# Patient Record
Sex: Female | Born: 1985 | Race: White | Hispanic: No | Marital: Married | Smoking: Former smoker
Health system: Southern US, Community
[De-identification: ages and names within clinical notes are randomized; demographics above are authoritative.]

## PROBLEM LIST (undated history)

## (undated) ENCOUNTER — Inpatient Hospital Stay (HOSPITAL_COMMUNITY): Payer: Self-pay

## (undated) DIAGNOSIS — F419 Anxiety disorder, unspecified: Secondary | ICD-10-CM

## (undated) DIAGNOSIS — K219 Gastro-esophageal reflux disease without esophagitis: Secondary | ICD-10-CM

## (undated) DIAGNOSIS — F410 Panic disorder [episodic paroxysmal anxiety] without agoraphobia: Secondary | ICD-10-CM

## (undated) DIAGNOSIS — K5900 Constipation, unspecified: Secondary | ICD-10-CM

## (undated) DIAGNOSIS — Z973 Presence of spectacles and contact lenses: Secondary | ICD-10-CM

## (undated) DIAGNOSIS — T8859XA Other complications of anesthesia, initial encounter: Secondary | ICD-10-CM

## (undated) DIAGNOSIS — N200 Calculus of kidney: Secondary | ICD-10-CM

## (undated) DIAGNOSIS — T4145XA Adverse effect of unspecified anesthetic, initial encounter: Secondary | ICD-10-CM

## (undated) DIAGNOSIS — Z809 Family history of malignant neoplasm, unspecified: Secondary | ICD-10-CM

## (undated) DIAGNOSIS — F32A Depression, unspecified: Secondary | ICD-10-CM

## (undated) DIAGNOSIS — G9050 Complex regional pain syndrome I, unspecified: Secondary | ICD-10-CM

## (undated) DIAGNOSIS — K259 Gastric ulcer, unspecified as acute or chronic, without hemorrhage or perforation: Secondary | ICD-10-CM

## (undated) DIAGNOSIS — F329 Major depressive disorder, single episode, unspecified: Secondary | ICD-10-CM

## (undated) DIAGNOSIS — T7840XA Allergy, unspecified, initial encounter: Secondary | ICD-10-CM

## (undated) HISTORY — DX: Constipation, unspecified: K59.00

## (undated) HISTORY — DX: Depression, unspecified: F32.A

## (undated) HISTORY — DX: Presence of spectacles and contact lenses: Z97.3

## (undated) HISTORY — PX: OTHER SURGICAL HISTORY: SHX169

## (undated) HISTORY — DX: Gastro-esophageal reflux disease without esophagitis: K21.9

## (undated) HISTORY — DX: Major depressive disorder, single episode, unspecified: F32.9

## (undated) HISTORY — DX: Panic disorder (episodic paroxysmal anxiety): F41.0

## (undated) HISTORY — DX: Allergy, unspecified, initial encounter: T78.40XA

## (undated) HISTORY — DX: Anxiety disorder, unspecified: F41.9

## (undated) HISTORY — DX: Family history of malignant neoplasm, unspecified: Z80.9

## (undated) HISTORY — PX: LITHOTRIPSY: SUR834

## (undated) HISTORY — PX: ESOPHAGOGASTRODUODENOSCOPY: SHX1529

## (undated) HISTORY — DX: Gastric ulcer, unspecified as acute or chronic, without hemorrhage or perforation: K25.9

## (undated) HISTORY — DX: Calculus of kidney: N20.0

## (undated) SURGERY — Surgical Case
Anesthesia: *Unknown

---
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
## 2004-01-18 ENCOUNTER — Encounter: Admission: RE | Admit: 2004-01-18 | Discharge: 2004-01-18 | Payer: Self-pay | Admitting: Family Medicine

## 2004-06-18 ENCOUNTER — Emergency Department (HOSPITAL_COMMUNITY): Admission: EM | Admit: 2004-06-18 | Discharge: 2004-06-19 | Payer: Self-pay | Admitting: Emergency Medicine

## 2005-01-15 ENCOUNTER — Encounter: Admission: RE | Admit: 2005-01-15 | Discharge: 2005-01-15 | Payer: Self-pay | Admitting: Family Medicine

## 2006-01-02 ENCOUNTER — Emergency Department (HOSPITAL_COMMUNITY): Admission: EM | Admit: 2006-01-02 | Discharge: 2006-01-02 | Payer: Self-pay | Admitting: Emergency Medicine

## 2007-01-11 ENCOUNTER — Ambulatory Visit: Payer: Self-pay | Admitting: Internal Medicine

## 2007-01-20 ENCOUNTER — Ambulatory Visit: Payer: Self-pay | Admitting: Internal Medicine

## 2007-01-20 ENCOUNTER — Encounter (INDEPENDENT_AMBULATORY_CARE_PROVIDER_SITE_OTHER): Payer: Self-pay | Admitting: Specialist

## 2008-10-04 ENCOUNTER — Emergency Department (HOSPITAL_COMMUNITY): Admission: EM | Admit: 2008-10-04 | Discharge: 2008-10-04 | Payer: Self-pay | Admitting: Family Medicine

## 2009-02-26 ENCOUNTER — Emergency Department (HOSPITAL_COMMUNITY): Admission: EM | Admit: 2009-02-26 | Discharge: 2009-02-26 | Payer: Self-pay | Admitting: Family Medicine

## 2009-06-04 ENCOUNTER — Emergency Department (HOSPITAL_COMMUNITY): Admission: EM | Admit: 2009-06-04 | Discharge: 2009-06-04 | Payer: Self-pay | Admitting: Emergency Medicine

## 2009-09-06 ENCOUNTER — Ambulatory Visit (HOSPITAL_COMMUNITY): Admission: RE | Admit: 2009-09-06 | Discharge: 2009-09-06 | Payer: Self-pay | Admitting: Psychiatry

## 2009-09-06 ENCOUNTER — Emergency Department (HOSPITAL_COMMUNITY): Admission: EM | Admit: 2009-09-06 | Discharge: 2009-09-07 | Payer: Self-pay | Admitting: Emergency Medicine

## 2009-09-07 ENCOUNTER — Inpatient Hospital Stay: Payer: Self-pay | Admitting: Psychiatry

## 2010-02-11 ENCOUNTER — Emergency Department (HOSPITAL_COMMUNITY): Admission: EM | Admit: 2010-02-11 | Discharge: 2010-02-11 | Payer: Self-pay | Admitting: Emergency Medicine

## 2010-04-12 ENCOUNTER — Emergency Department (HOSPITAL_COMMUNITY): Admission: EM | Admit: 2010-04-12 | Discharge: 2010-04-12 | Payer: Self-pay | Admitting: Emergency Medicine

## 2011-01-27 LAB — POCT URINALYSIS DIP (DEVICE)
Bilirubin Urine: NEGATIVE
Glucose, UA: NEGATIVE mg/dL
Ketones, ur: NEGATIVE mg/dL
Nitrite: NEGATIVE

## 2011-01-29 LAB — URINALYSIS, ROUTINE W REFLEX MICROSCOPIC
Nitrite: NEGATIVE
Protein, ur: 30 mg/dL — AB
Urobilinogen, UA: 1 mg/dL (ref 0.0–1.0)
pH: 5.5 (ref 5.0–8.0)

## 2011-01-29 LAB — BASIC METABOLIC PANEL
CO2: 23 mEq/L (ref 19–32)
Calcium: 8.7 mg/dL (ref 8.4–10.5)
Chloride: 107 mEq/L (ref 96–112)
Creatinine, Ser: 0.8 mg/dL (ref 0.4–1.2)
GFR calc non Af Amer: >60 mL/min (ref >60)

## 2011-01-29 LAB — URINE CULTURE: Colony Count: >=100000

## 2011-01-29 LAB — WET PREP, GENITAL

## 2011-01-29 LAB — DIFFERENTIAL
Basophils Relative: 0 % (ref 0–1)
Eosinophils Absolute: 0.1 10*3/uL (ref 0.0–0.7)
Eosinophils Relative: 1 % (ref 0–5)
Lymphocytes Relative: 22 % (ref 12–46)

## 2011-01-29 LAB — CBC
HCT: 40.3 % (ref 36.0–46.0)
Hemoglobin: 13.6 g/dL (ref 12.0–15.0)
MCHC: 33.8 g/dL (ref 30.0–36.0)
MCV: 93.8 fL (ref 78.0–100.0)
RDW: 13.7 % (ref 11.5–15.5)
WBC: 11.1 10*3/uL — ABNORMAL HIGH (ref 4.0–10.5)

## 2011-01-29 LAB — URINE MICROSCOPIC-ADD ON

## 2011-02-13 LAB — ETHANOL: Alcohol, Ethyl (B): <5 mg/dL (ref 0–10)

## 2011-02-13 LAB — DIFFERENTIAL
Eosinophils Absolute: 0 10*3/uL (ref 0.0–0.7)
Eosinophils Relative: 0 % (ref 0–5)
Monocytes Absolute: 0.4 10*3/uL (ref 0.1–1.0)
Monocytes Relative: 6 % (ref 3–12)
Neutro Abs: 4.9 10*3/uL (ref 1.7–7.7)
Neutrophils Relative %: 70 % (ref 43–77)

## 2011-02-13 LAB — RAPID URINE DRUG SCREEN, HOSP PERFORMED
Barbiturates: NOT DETECTED
Tetrahydrocannabinol: POSITIVE — AB

## 2011-02-13 LAB — CBC
Hemoglobin: 14.5 g/dL (ref 12.0–15.0)
MCHC: 34.8 g/dL (ref 30.0–36.0)
MCV: 94.8 fL (ref 78.0–100.0)
RDW: 12.8 % (ref 11.5–15.5)

## 2011-02-13 LAB — BASIC METABOLIC PANEL
Creatinine, Ser: 0.63 mg/dL (ref 0.4–1.2)
GFR calc Af Amer: >60 mL/min (ref >60)
Glucose, Bld: 111 mg/dL — ABNORMAL HIGH (ref 70–99)
Sodium: 135 mEq/L (ref 135–145)

## 2011-02-19 LAB — DIFFERENTIAL
Basophils Absolute: 0 10*3/uL (ref 0.0–0.1)
Basophils Relative: 1 % (ref 0–1)
Eosinophils Relative: 2 % (ref 0–5)
Lymphocytes Relative: 41 % (ref 12–46)
Monocytes Absolute: 0.4 10*3/uL (ref 0.1–1.0)
Monocytes Relative: 8 % (ref 3–12)
Neutro Abs: 2.5 10*3/uL (ref 1.7–7.7)
Neutrophils Relative %: 48 % (ref 43–77)

## 2011-02-19 LAB — T3: T3, Total: 75 ng/dl — ABNORMAL LOW (ref 80.0–204.0)

## 2011-02-19 LAB — POCT URINALYSIS DIP (DEVICE)
Glucose, UA: NEGATIVE mg/dL
Hgb urine dipstick: NEGATIVE
pH: 7.5 (ref 5.0–8.0)

## 2011-02-19 LAB — CBC
Hemoglobin: 15.8 g/dL — ABNORMAL HIGH (ref 12.0–15.0)
MCHC: 35.7 g/dL (ref 30.0–36.0)
RBC: 4.75 MIL/uL (ref 3.87–5.11)
WBC: 5.2 10*3/uL (ref 4.0–10.5)

## 2011-02-19 LAB — HIV ANTIBODY (ROUTINE TESTING W REFLEX): HIV: NONREACTIVE

## 2011-03-28 NOTE — Op Note (Signed)
   NAME:  ERMAL, BRZOZOWSKI                      ACCOUNT NO.:  [id number]   MEDICAL RECORD NO.:  [id number]                   PATIENT TYPE:  AMB   LOCATION:  [facility]SC                                  FACILITY:  [facility]   PHYSICIAN:  Leonia Corona, M.D.               DATE OF BIRTH:  1986/03/12   DATE OF PROCEDURE:  DATE OF DISCHARGE:                                 OPERATIVE REPORT   PREOPERATIVE DIAGNOSES:  Left painful heel radiculopathy with possible  foreign body.   POSTOPERATIVE DIAGNOSES:  Left painful heel radiculopathy with possible  foreign body.   PROCEDURE:  Exploration of left heel for foreign body.   ANESTHESIA:  Topical plus local.   SURGEON:  Leonia Corona, M.D.   ASSISTANT:  Nurse.   DESCRIPTION OF PROCEDURE:  The procedure is performed in the minor surgery  room. The patient is brought into the room, placed prone on the operating  table.  The left heel was elevated and exposed clearly.  The left heel was  already covered with topical antibiotic cream prior to injection of local  anesthesia.  The area was cleaned, prepped and draped in the usual manner.  Approximately 4.0 cc of 1% lidocaine with epinephrine was infiltrated around  the callosity in the tender spot and an elliptical incision was made  enclosing the central core of the tender spot and it was deepened through  the deeper layers and a core of the tissue was removed.  The area was  inspected and explored into the planter fascia for any foreign body; none  was noted.  The wound was irrigated and then packed with Betadine ointment,  having found no foreign body in the wound.  The wound was kept open and  allowed to heal by granulation.  A sterile gauze dressing was applied which  was covered with Kerlix and Ace wrap.  The patient tolerated the procedure  very well which was smooth and uneventful.  The patient was later allowed to  go home with daily dressing changes using warm soaks and Neosporin  ointment.  February 28, 2003                                               Leonia Corona, M.D.    SF/MEDQ  D:  03/01/2003  T:  03/01/2003  Job:  [id number]

## 2011-03-28 NOTE — Assessment & Plan Note (Signed)
[facility]                         GASTROENTEROLOGY OFFICE NOTE   JOMAYRA, NOVITSKY                   MRN:          [id number]  DATE:01/11/2007                            DOB:          01/30/1986    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  She has had burning substernally and also some  epigastric pain which is worse with drinking alcohol.  She dates onset  of heartburn  to grade school.  She was seen by pediatrician for  abdominal pain.  Later on, about 3 years ago, upper GI series was done  because of gastroesophageal reflux.  Apparently, the exam was normal but  she was put on Nexium 40 mg daily.  She stayed on it for a while.  It  helped about 50% of the time but did not eliminate the reflux  completely.  Her father died 1 year ago with Barrett's esophagus and  adenocarcinoma of the esophagus.  He was a patient in our practice and  we took care of him.  Her brother, who is about 46 years old, also has  gastroesophageal reflux and we saw him also as a patient last year and  he had upper endoscopy but did not have Barrett's esophagus.   Amber Pope has lost some weight because of decreased appetite and  depression.  She was put on Lexapro after the death of her father at the  dose 10 mg a day but is no longer on it.  She said she was recently  hospitalized at [facility] with kidney stones and underwent stent  placement and removal of kidney stones.  She had CT scans and exams  under sedation at [facility] in [place].  She denies  hoarseness, change of the voice, choking or coughing at night.   MEDICATIONS:  None.   PAST MEDICAL HISTORY:  1. Significant for asthmatic bronchitis.  2. Anxiety.  3. Panic disorder.  4. Depression.  5. Kidney stones.  6. Allergies.   FAMILY HISTORY:  Her father had cancer of the esophagus and died last  year.   SOCIAL HISTORY:  She  is single.  Archivist.  Currently withdrew  for medical reasons.  She smokes 1-2 cigarettes a day and drinks excess  alcohol at least 12 beers a week and probably liquor and wine on a daily  basis.   REVIEW OF SYSTEMS:  Positive for allergies, frequent cough, sleeping  problems, blood in urine.   PHYSICAL EXAMINATION:  Blood pressure 100/62, pulse 76 and weight 112  pounds.  Usual weight 98 to 120 pounds.  She was alert, oriented and in  no distress.  Sclerae nonicteric.  Oral cavity normal.  Neck was supple.  No lymphadenopathy.  Lungs were clear to auscultation.  Cor with normal  S1-S2.  Abdomen was soft, tender in the epigastrium and subxiphoid area.  No distention.  Normal active bowel sounds.  Right upper quadrant was  normal.  Rectal exam not done.  Extremities show edema.   IMPRESSION:  A 26 year old white female with chronic gastroesophageal  reflux  and family history of esophageal cancer in her father and reflux  in her brother who has been under a great deal of stress. Smoking and  drinking excess alcohol  has undoubtedly been a contributing factor .She  is at high risk for  Barrett's esophagus.  She is currently not on any  medication because she ran out of Nexium which was not helping  completely anyway.   PLAN:  1. Upper endoscopy with biopsies to assess for anatomic abnormalities.  2. I have asked the patient to stop drinking alcohol and gave her a      rather serious talk about it.  3. Avoid caffeine and smoking as well.  4. Nexium 40 mg daily.  Prescription as well as samples given.  She      was started on 2 a day for the next 3 days and then 1 a day.  5. Prescription for Lexapro 10 mg daily.  She was on it in the past      but needs a new prescription.     Hedwig Morton. Juanda Chance, MD  Electronically Signed    DMB/MedQ  DD: 01/11/2007  DT: 01/12/2007  Job #: [id number]

## 2011-09-04 ENCOUNTER — Encounter: Payer: Self-pay | Admitting: *Deleted

## 2011-09-04 ENCOUNTER — Telehealth: Payer: Self-pay | Admitting: Internal Medicine

## 2011-09-04 NOTE — Telephone Encounter (Signed)
OK 

## 2011-09-04 NOTE — Telephone Encounter (Signed)
Spoke with patient and she states she is living in [place] now. States since August, she has been to the ER there x2 with vomiting blood and in the hospital x 3. She states she has lost weight and now weighs 94 lbs.The hospital there has referred her to the "free clinic" for EGD but she has not been scheduled. She has no insurance. She is taking Protonix daily and Pepcid AC and Gaviscon prn. She wants to be seen by Dr. Juanda Chance again, She states she wants to pay for the visit and she her because she has seen her in the past. Scheduled patient on 09/16/11 at 10:45AM.  She understands to seek care at ER in [place] if she has problems before her visit.Patient is going to have all records faxed to [place] from her hospital visits in [place]. Mailed patient a letter and information about paying your bill with [facility]. Last OV 01/11/2007, Last EGD 01/20/2007.

## 2011-09-15 ENCOUNTER — Encounter: Payer: Self-pay | Admitting: Internal Medicine

## 2011-09-16 ENCOUNTER — Encounter: Payer: Self-pay | Admitting: Internal Medicine

## 2011-09-16 ENCOUNTER — Ambulatory Visit (INDEPENDENT_AMBULATORY_CARE_PROVIDER_SITE_OTHER): Payer: Self-pay | Admitting: Internal Medicine

## 2011-09-16 VITALS — BP 90/68 | HR 68 | Ht 64.0 in | Wt 101.2 lb

## 2011-09-16 DIAGNOSIS — R112 Nausea with vomiting, unspecified: Secondary | ICD-10-CM

## 2011-09-16 DIAGNOSIS — K219 Gastro-esophageal reflux disease without esophagitis: Secondary | ICD-10-CM

## 2011-09-16 DIAGNOSIS — K920 Hematemesis: Secondary | ICD-10-CM

## 2011-09-16 MED ORDER — PROMETHAZINE HCL 25 MG RE SUPP: 25.0000 mg | Freq: Four times a day (QID) | RECTAL | Status: DC | PRN

## 2011-09-16 MED ORDER — PROMETHAZINE HCL 25 MG PO TABS: 25.0000 mg | ORAL_TABLET | Freq: Four times a day (QID) | ORAL | Status: DC | PRN

## 2011-09-16 MED ORDER — ESOMEPRAZOLE MAGNESIUM 40 MG PO CPDR: 40.0000 mg | DELAYED_RELEASE_CAPSULE | Freq: Every day | ORAL | Status: DC

## 2011-09-16 NOTE — Progress Notes (Signed)
Amber Pope 08-Nov-1986 MRN [id number]   History of Present Illness:  This is a 25 year old black female with epigastric pain and intractable nausea and vomiting of several weeks duration. She was seen in the emergency room on 3 separate occasions and was sent home. A CT scan of the abdomen shows small ovarian cysts. I saw her in 2008 for gastroesophageal reflux. She has a history of a functional GI disorder. Her father died of gastric cancer from Barrett's esophagus. Her upper endoscopy in the March 2008 was essentially normal. She has been on Pepcid and Protonix without much improvement. She has a lot of anxiety and panic disorder. She was having coffee ground emesis on one of the occasions.   Past Medical History  Diagnosis Date  . GERD (gastroesophageal reflux disease)   . Renal calculus   . Depression   . Panic disorder    Past Surgical History  Procedure Date  . Lithotripsy     x 2  . Kidney stent placement     with later removal    reports that she has never smoked. She has never used smokeless tobacco. She reports that she drinks alcohol. She reports that she does not use illicit drugs. family history includes Barrett's esophagus in her father; Diabetes in her maternal grandfather and paternal grandfather; Esophageal cancer in her father; GER disease in her father and mother; and Ulcers in her brother. Allergies  Allergen Reactions  . Lodine (Etodolac)         Review of Systems: Denies dysphagia or odynophagia. Denies that diarrhea or rectal bleeding  The remainder of the 10 point ROS is negative except as outlined in H&P   Physical Exam: General appearance  Well developed, in no distress. Eyes- non icteric. HEENT nontraumatic, normocephalic. Mouth no lesions, tongue papillated, no cheilosis. Neck supple without adenopathy, thyroid not enlarged, no carotid bruits, no JVD. Lungs Clear to auscultation bilaterally. Cor normal S1, normal S2, regular rhythm, no  murmur,  quiet precordium. Abdomen: Soft nontender with normoactive bowel sounds. No distention. Rectal: Soft Hemoccult negative stool. Extremities no pedal edema. Skin no lesions. Neurological alert and oriented x 3. Psychological normal mood and affect.  Assessment and Plan:  Problem #1 Intractable nausea, vomiting and weight loss of 6 pounds in a patient with anxiety and panic disorder already on medications which include Zoloft and Xanax. She has had recent coffee ground emesis indicating either gastritis or esophagitis or possibly a Mallory-Weiss tear. She has no insurance so her workup has been limited. We will proceed with an upper endoscopy. I have given her samples of Nexium 40 mg twice a day. I have also sent to the pharmacy Phenergan 25 mg tablets as well as suppositories. She will purchase Ensure and Boost for nutritional supplementation.   09/16/2011 Amber Pope

## 2011-09-16 NOTE — Patient Instructions (Signed)
You have been scheduled for an endoscopy. Please follow written instructions given to you at your visit today. We have sent the following medications to your pharmacy for you to pick up at your convenience: Phenergan Suppositories Phenergan tablets We have given you samples of Nexium to take twice daily.

## 2011-09-17 ENCOUNTER — Ambulatory Visit (AMBULATORY_SURGERY_CENTER): Payer: Self-pay | Admitting: Internal Medicine

## 2011-09-17 ENCOUNTER — Encounter: Payer: Self-pay | Admitting: Internal Medicine

## 2011-09-17 VITALS — BP 113/73 | HR 78 | Temp 98.6°F | Resp 18 | Ht 64.0 in | Wt 101.0 lb

## 2011-09-17 DIAGNOSIS — K219 Gastro-esophageal reflux disease without esophagitis: Secondary | ICD-10-CM

## 2011-09-17 DIAGNOSIS — R112 Nausea with vomiting, unspecified: Secondary | ICD-10-CM

## 2011-09-17 DIAGNOSIS — K920 Hematemesis: Secondary | ICD-10-CM

## 2011-09-17 MED ORDER — HYDROCODONE-ACETAMINOPHEN 5-500 MG PO TABS: 1.0000 | ORAL_TABLET | Freq: Four times a day (QID) | ORAL | Status: AC | PRN

## 2011-09-17 MED ORDER — SODIUM CHLORIDE 0.9 % IV SOLN: 500.0000 mL | INTRAVENOUS | Status: DC

## 2011-09-17 NOTE — Patient Instructions (Signed)
Please refer to blue and green discharge instruction sheets. 

## 2011-09-18 ENCOUNTER — Telehealth: Payer: Self-pay | Admitting: *Deleted

## 2011-09-18 NOTE — Telephone Encounter (Signed)
Voicemail message left

## 2011-09-22 ENCOUNTER — Encounter: Payer: Self-pay | Admitting: Internal Medicine

## 2011-10-23 ENCOUNTER — Telehealth: Payer: Self-pay | Admitting: Internal Medicine

## 2011-10-23 NOTE — Telephone Encounter (Signed)
Letter created and mailed to patient. 

## 2012-12-27 ENCOUNTER — Other Ambulatory Visit: Payer: Self-pay | Admitting: Obstetrics and Gynecology

## 2013-01-05 ENCOUNTER — Encounter (HOSPITAL_COMMUNITY)
Admission: RE | Admit: 2013-01-05 | Discharge: 2013-01-05 | Disposition: A | Discharge status: Home / Self Care | Payer: BC Managed Care – PPO | Source: Ambulatory Visit | Attending: Obstetrics and Gynecology | Admitting: Obstetrics and Gynecology

## 2013-01-05 ENCOUNTER — Encounter (HOSPITAL_COMMUNITY): Payer: Self-pay

## 2013-01-05 HISTORY — DX: Other complications of anesthesia, initial encounter: T88.59XA

## 2013-01-05 HISTORY — DX: Adverse effect of unspecified anesthetic, initial encounter: T41.45XA

## 2013-01-05 LAB — CBC
HCT: 37.7 % (ref 36.0–46.0)
Hemoglobin: 12.9 g/dL (ref 12.0–15.0)
MCH: 32.2 pg (ref 26.0–34.0)
RBC: 4.01 MIL/uL (ref 3.87–5.11)

## 2013-01-05 LAB — SURGICAL PCR SCREEN
MRSA, PCR: INVALID — AB
Staphylococcus aureus: INVALID — AB

## 2013-01-05 NOTE — Patient Instructions (Addendum)
20 Amber Pope  01/05/2013   Your procedure is scheduled on:  01/06/13  Enter through the Main Entrance of [facility] at 830 AM.  Pick up the phone at the desk and dial [phone].   Call this number if you have problems the morning of surgery: [phone]   Remember:   Do not eat food:After Midnight.  Do not drink clear liquids: After Midnight.  Take these medicines the morning of surgery with A SIP OF WATER: Xanax if needed, take Protonix may take Prozac and Zoloft   Do not wear jewelry, make-up or nail polish.  Do not wear lotions, powders, or perfumes. You may wear deodorant.  Do not shave 48 hours prior to surgery.  Do not bring valuables to the hospital.  Contacts, dentures or bridgework may not be worn into surgery.  Leave suitcase in the car. After surgery it may be brought to your room.  For patients admitted to the hospital, checkout time is 11:00 AM the day of discharge.   Patients discharged the day of surgery will not be allowed to drive home.  Name and phone number of your driver: Mother    Amber Pope  Special Instructions: Shower using CHG 2 nights before surgery and the night before surgery.  If you shower the day of surgery use CHG.  Use special wash - you have one bottle of CHG for all showers.  You should use approximately 1/3 of the bottle for each shower.   Please read over the following fact sheets that you were given: MRSA Information

## 2013-01-05 NOTE — H&P (Signed)
[name], [name] NO.:  [id number]  MEDICAL RECORD NO.:  [id number]  LOCATION:                                 FACILITY:  PHYSICIAN:  Lenoard Aden, M.D.DATE OF BIRTH:  04/30/1986  DATE OF ADMISSION: DATE OF DISCHARGE:                             HISTORY & PHYSICAL   CHIEF COMPLAINT:  The patient has right lower quadrant pain with a history of ovarian cyst, suggest endometriosis.  She is a 27 year old white female, G0, P0 with chronic right lower quadrant pain, history of ovarian cyst, dysmenorrhea, dyspareunia who presents for diagnostic laparoscopy and possible daVinci assisted resection and ablation of endometriosis.  MEDICATIONS:   Xanax and Prozac.  She has allergies to codeine.  She has a family history of esophageal cancer, type 2 diabetes, and thyroid disease.  SOCIAL HISTORY:  Noncontributory.  SURGICAL HISTORY:  Noncontributory.  FAMILY HISTORY:  Noncontributory.  PHYSICAL EXAMINATION:  GENERAL:  She is a well-developed and well- nourished white female, in no acute distress. HEENT:  Normal. NECK:  Supple.  Full range of motion. LUNGS:  Clear. HEART:  Regular rhythm. ABDOMEN:  Soft and nontender. PELVIC:  Tenderness in bilateral lower quadrants, no mass. EXTREMITIES:  There were no cords. NEUROLOGIC:  Nonfocal. SKIN:  Intact.  IMPRESSION:  Chronic right lower quadrant pain with history of ovarian cyst, possible endometriosis.  Family history noted.  PLAN:  Proceed with Diagnostic Laparoscopy, possible daVinci assisted procedure as noted.   Risks of anesthesia, infection, bleeding, injury to abdominal organs were discussed.  Delayed versus immediate complications to include bowel and bladder injury noted. Inability to diagnose and cure all pain discussed. The patient acknowledges and wishes to proceed.     Lenoard Aden, M.D.     RJT/MEDQ  D:  01/05/2013  T:  01/05/2013  Job:  [id number]

## 2013-01-06 ENCOUNTER — Encounter (HOSPITAL_COMMUNITY): Payer: Self-pay | Admitting: *Deleted

## 2013-01-06 ENCOUNTER — Ambulatory Visit (HOSPITAL_COMMUNITY): Payer: BC Managed Care – PPO | Admitting: Registered Nurse

## 2013-01-06 ENCOUNTER — Encounter (HOSPITAL_COMMUNITY): Payer: Self-pay | Admitting: Registered Nurse

## 2013-01-06 ENCOUNTER — Ambulatory Visit (HOSPITAL_COMMUNITY)
Admission: RE | Admit: 2013-01-06 | Discharge: 2013-01-06 | Disposition: A | Discharge status: Home / Self Care | Payer: BC Managed Care – PPO | Source: Ambulatory Visit | Attending: Obstetrics and Gynecology | Admitting: Obstetrics and Gynecology

## 2013-01-06 ENCOUNTER — Encounter (HOSPITAL_COMMUNITY)
Admission: RE | Disposition: A | Discharge status: Home / Self Care | Payer: Self-pay | Source: Ambulatory Visit | Attending: Obstetrics and Gynecology

## 2013-01-06 DIAGNOSIS — N80109 Endometriosis of ovary, unspecified side, unspecified depth: Secondary | ICD-10-CM | POA: Insufficient documentation

## 2013-01-06 DIAGNOSIS — N803 Endometriosis of pelvic peritoneum, unspecified: Secondary | ICD-10-CM | POA: Insufficient documentation

## 2013-01-06 DIAGNOSIS — IMO0002 Reserved for concepts with insufficient information to code with codable children: Secondary | ICD-10-CM | POA: Insufficient documentation

## 2013-01-06 DIAGNOSIS — N801 Endometriosis of ovary: Secondary | ICD-10-CM | POA: Insufficient documentation

## 2013-01-06 DIAGNOSIS — N946 Dysmenorrhea, unspecified: Secondary | ICD-10-CM | POA: Insufficient documentation

## 2013-01-06 DIAGNOSIS — N809 Endometriosis, unspecified: Secondary | ICD-10-CM

## 2013-01-06 DIAGNOSIS — R1031 Right lower quadrant pain: Secondary | ICD-10-CM | POA: Insufficient documentation

## 2013-01-06 DIAGNOSIS — D252 Subserosal leiomyoma of uterus: Secondary | ICD-10-CM | POA: Insufficient documentation

## 2013-01-06 HISTORY — PX: ABLATION ON ENDOMETRIOSIS: SHX5787

## 2013-01-06 HISTORY — PX: ROBOTIC ASSISTED LAPAROSCOPIC LYSIS OF ADHESION: SHX6080

## 2013-01-06 SURGERY — ROBOTIC ASSISTED LAPAROSCOPIC LYSIS OF ADHESION
Anesthesia: General | Wound class: Clean Contaminated

## 2013-01-06 MED ORDER — CEFAZOLIN SODIUM-DEXTROSE 2-3 GM-% IV SOLR
2.0000 g | INTRAVENOUS | Status: AC
  Administered 2013-01-06: 2 g via INTRAVENOUS

## 2013-01-06 MED ORDER — ACETAMINOPHEN 10 MG/ML IV SOLN: 1000.0000 mg | Freq: Four times a day (QID) | INTRAVENOUS | Status: DC

## 2013-01-06 MED ORDER — FENTANYL CITRATE 0.05 MG/ML IJ SOLN
INTRAMUSCULAR | Status: AC
  Filled 2013-01-06: qty 5

## 2013-01-06 MED ORDER — OXYCODONE-ACETAMINOPHEN 5-325 MG PO TABS: 1.0000 | ORAL_TABLET | ORAL | Status: DC | PRN

## 2013-01-06 MED ORDER — MIDAZOLAM HCL 2 MG/2ML IJ SOLN
0.5000 mg | Freq: Once | INTRAMUSCULAR | Status: AC | PRN
Start: 2013-01-06 — End: 2013-01-06

## 2013-01-06 MED ORDER — FENTANYL CITRATE 0.05 MG/ML IJ SOLN
25.0000 ug | INTRAMUSCULAR | Status: DC | PRN
  Administered 2013-01-06 (×2): 50 ug via INTRAVENOUS

## 2013-01-06 MED ORDER — ONDANSETRON HCL 4 MG/2ML IJ SOLN
INTRAMUSCULAR | Status: AC
  Filled 2013-01-06: qty 2

## 2013-01-06 MED ORDER — DEXAMETHASONE SODIUM PHOSPHATE 10 MG/ML IJ SOLN
INTRAMUSCULAR | Status: DC | PRN
  Administered 2013-01-06: 10 mg via INTRAVENOUS

## 2013-01-06 MED ORDER — ROCURONIUM BROMIDE 100 MG/10ML IV SOLN
INTRAVENOUS | Status: DC | PRN
  Administered 2013-01-06: 50 mg via INTRAVENOUS

## 2013-01-06 MED ORDER — LIDOCAINE HCL (CARDIAC) 20 MG/ML IV SOLN
INTRAVENOUS | Status: AC
  Filled 2013-01-06: qty 5

## 2013-01-06 MED ORDER — LACTATED RINGERS IV SOLN
INTRAVENOUS | Status: DC
  Administered 2013-01-06 (×2): via INTRAVENOUS

## 2013-01-06 MED ORDER — CEFAZOLIN SODIUM-DEXTROSE 2-3 GM-% IV SOLR
INTRAVENOUS | Status: AC
  Filled 2013-01-06: qty 50

## 2013-01-06 MED ORDER — PROMETHAZINE HCL 25 MG/ML IJ SOLN: 6.2500 mg | INTRAMUSCULAR | Status: DC | PRN

## 2013-01-06 MED ORDER — GLYCOPYRROLATE 0.2 MG/ML IJ SOLN
INTRAMUSCULAR | Status: DC | PRN
  Administered 2013-01-06: 0.6 mg via INTRAVENOUS

## 2013-01-06 MED ORDER — OXYCODONE-ACETAMINOPHEN 5-325 MG PO TABS
1.0000 | ORAL_TABLET | Freq: Once | ORAL | Status: AC
  Administered 2013-01-06: 1 via ORAL

## 2013-01-06 MED ORDER — MEPERIDINE HCL 25 MG/ML IJ SOLN: 6.2500 mg | INTRAMUSCULAR | Status: DC | PRN

## 2013-01-06 MED ORDER — FAMOTIDINE 20 MG PO TABS
20.0000 mg | ORAL_TABLET | Freq: Once | ORAL | Status: AC
  Administered 2013-01-06: 20 mg via ORAL
  Filled 2013-01-06: qty 1

## 2013-01-06 MED ORDER — BUPIVACAINE HCL (PF) 0.25 % IJ SOLN
INTRAMUSCULAR | Status: AC
  Filled 2013-01-06: qty 30

## 2013-01-06 MED ORDER — DEXAMETHASONE SODIUM PHOSPHATE 10 MG/ML IJ SOLN
INTRAMUSCULAR | Status: AC
  Filled 2013-01-06: qty 1

## 2013-01-06 MED ORDER — ROCURONIUM BROMIDE 50 MG/5ML IV SOLN
INTRAVENOUS | Status: AC
  Filled 2013-01-06: qty 1

## 2013-01-06 MED ORDER — ALBUTEROL SULFATE (5 MG/ML) 0.5% IN NEBU: 2.5000 mg | INHALATION_SOLUTION | Freq: Once | RESPIRATORY_TRACT | Status: DC

## 2013-01-06 MED ORDER — MIDAZOLAM HCL 5 MG/5ML IJ SOLN
INTRAMUSCULAR | Status: DC | PRN
  Administered 2013-01-06: 2 mg via INTRAVENOUS

## 2013-01-06 MED ORDER — OXYCODONE-ACETAMINOPHEN 5-325 MG PO TABS
ORAL_TABLET | ORAL | Status: AC
  Filled 2013-01-06: qty 1

## 2013-01-06 MED ORDER — PROPOFOL 10 MG/ML IV BOLUS
INTRAVENOUS | Status: DC | PRN
  Administered 2013-01-06: 200 mg via INTRAVENOUS

## 2013-01-06 MED ORDER — ALBUTEROL SULFATE HFA 108 (90 BASE) MCG/ACT IN AERS: 2.0000 | INHALATION_SPRAY | Freq: Once | RESPIRATORY_TRACT | Status: AC

## 2013-01-06 MED ORDER — ARTIFICIAL TEARS OP OINT
TOPICAL_OINTMENT | OPHTHALMIC | Status: AC
  Filled 2013-01-06: qty 3.5

## 2013-01-06 MED ORDER — ACETAMINOPHEN 10 MG/ML IV SOLN
INTRAVENOUS | Status: AC
  Administered 2013-01-06: 1000 mg via INTRAVENOUS
  Filled 2013-01-06: qty 100

## 2013-01-06 MED ORDER — GLYCOPYRROLATE 0.2 MG/ML IJ SOLN
INTRAMUSCULAR | Status: AC
  Filled 2013-01-06: qty 3

## 2013-01-06 MED ORDER — FENTANYL CITRATE 0.05 MG/ML IJ SOLN
INTRAMUSCULAR | Status: DC | PRN
  Administered 2013-01-06: 50 ug via INTRAVENOUS
  Administered 2013-01-06: 150 ug via INTRAVENOUS
  Administered 2013-01-06: 100 ug via INTRAVENOUS
  Administered 2013-01-06 (×4): 50 ug via INTRAVENOUS

## 2013-01-06 MED ORDER — FENTANYL CITRATE 0.05 MG/ML IJ SOLN
INTRAMUSCULAR | Status: AC
  Administered 2013-01-06: 50 ug via INTRAVENOUS
  Filled 2013-01-06: qty 2

## 2013-01-06 MED ORDER — MIDAZOLAM HCL 2 MG/2ML IJ SOLN
INTRAMUSCULAR | Status: AC
  Administered 2013-01-06: 0.5 mg via INTRAVENOUS
  Filled 2013-01-06: qty 2

## 2013-01-06 MED ORDER — ARTIFICIAL TEARS OP OINT
TOPICAL_OINTMENT | OPHTHALMIC | Status: DC | PRN
  Administered 2013-01-06: 1 via OPHTHALMIC

## 2013-01-06 MED ORDER — NEOSTIGMINE METHYLSULFATE 1 MG/ML IJ SOLN
INTRAMUSCULAR | Status: DC | PRN
  Administered 2013-01-06: 4 mg via INTRAVENOUS

## 2013-01-06 MED ORDER — RINGERS IRRIGATION IR SOLN
Status: DC | PRN
  Administered 2013-01-06: 1

## 2013-01-06 MED ORDER — LIDOCAINE HCL (CARDIAC) 20 MG/ML IV SOLN
INTRAVENOUS | Status: DC | PRN
  Administered 2013-01-06: 50 mg via INTRAVENOUS

## 2013-01-06 MED ORDER — ALBUTEROL SULFATE HFA 108 (90 BASE) MCG/ACT IN AERS
INHALATION_SPRAY | RESPIRATORY_TRACT | Status: AC
  Administered 2013-01-06: 2 via RESPIRATORY_TRACT
  Filled 2013-01-06: qty 6.7

## 2013-01-06 MED ORDER — MIDAZOLAM HCL 2 MG/2ML IJ SOLN
INTRAMUSCULAR | Status: AC
  Filled 2013-01-06: qty 2

## 2013-01-06 MED ORDER — ONDANSETRON HCL 4 MG/2ML IJ SOLN
INTRAMUSCULAR | Status: DC | PRN
  Administered 2013-01-06: 4 mg via INTRAVENOUS

## 2013-01-06 MED ORDER — PROPOFOL 10 MG/ML IV EMUL
INTRAVENOUS | Status: AC
  Filled 2013-01-06: qty 20

## 2013-01-06 MED ORDER — BUPIVACAINE HCL (PF) 0.25 % IJ SOLN
INTRAMUSCULAR | Status: DC | PRN
  Administered 2013-01-06: 6 mL

## 2013-01-06 SURGICAL SUPPLY — 79 items
ADH SKN CLS APL DERMABOND .7 (GAUZE/BANDAGES/DRESSINGS) ×1
BAG SPEC RTRVL LRG 6X4 10 (ENDOMECHANICALS)
BAG URINE DRAINAGE (UROLOGICAL SUPPLIES) ×2 IMPLANT
BARRIER ADHS 3X4 INTERCEED (GAUZE/BANDAGES/DRESSINGS) ×2 IMPLANT
BRR ADH 4X3 ABS CNTRL BYND (GAUZE/BANDAGES/DRESSINGS) ×2
CABLE HIGH FREQUENCY MONO STRZ (ELECTRODE) ×1 IMPLANT
CATH FOLEY 3WAY  5CC 16FR (CATHETERS) ×1
CATH FOLEY 3WAY 5CC 16FR (CATHETERS) ×1 IMPLANT
CATH ROBINSON RED A/P 16FR (CATHETERS) IMPLANT
CHLORAPREP W/TINT 26ML (MISCELLANEOUS) ×2 IMPLANT
CLOTH BEACON ORANGE TIMEOUT ST (SAFETY) ×2 IMPLANT
CONT PATH 16OZ SNAP LID 3702 (MISCELLANEOUS) ×2 IMPLANT
COVER MAYO STAND STRL (DRAPES) ×2 IMPLANT
COVER TABLE BACK 60X90 (DRAPES) ×4 IMPLANT
COVER TIP SHEARS 8 DVNC (MISCELLANEOUS) ×1 IMPLANT
COVER TIP SHEARS 8MM DA VINCI (MISCELLANEOUS) ×1
DECANTER SPIKE VIAL GLASS SM (MISCELLANEOUS) ×2 IMPLANT
DERMABOND ADVANCED (GAUZE/BANDAGES/DRESSINGS) ×1
DERMABOND ADVANCED .7 DNX12 (GAUZE/BANDAGES/DRESSINGS) ×1 IMPLANT
DRAPE HUG U DISPOSABLE (DRAPE) ×2 IMPLANT
DRAPE LG THREE QUARTER DISP (DRAPES) ×4 IMPLANT
DRAPE WARM FLUID 44X44 (DRAPE) ×2 IMPLANT
ELECT REM PT RETURN 9FT ADLT (ELECTROSURGICAL) ×2
ELECTRODE REM PT RTRN 9FT ADLT (ELECTROSURGICAL) ×1 IMPLANT
EVACUATOR SMOKE 8.L (FILTER) ×2 IMPLANT
FORCEPS CUTTING 33CM 5MM (CUTTING FORCEPS) IMPLANT
FORCEPS CUTTING 45CM 5MM (CUTTING FORCEPS) IMPLANT
GAUZE VASELINE 3X9 (GAUZE/BANDAGES/DRESSINGS) IMPLANT
GLOVE BIO SURGEON STRL SZ7.5 (GLOVE) ×4 IMPLANT
GOWN PREVENTION PLUS LG XLONG (DISPOSABLE) ×4 IMPLANT
GOWN PREVENTION PLUS XLARGE (GOWN DISPOSABLE) ×2 IMPLANT
GOWN STRL REIN XL XLG (GOWN DISPOSABLE) ×12 IMPLANT
GYRUS RUMI II 2.5CM BLUE (DISPOSABLE)
GYRUS RUMI II 3.5CM BLUE (DISPOSABLE)
GYRUS RUMI II 4.0CM BLUE (DISPOSABLE)
KIT ACCESSORY DA VINCI DISP (KITS) ×1
KIT ACCESSORY DVNC DISP (KITS) ×1 IMPLANT
LEGGING LITHOTOMY PAIR STRL (DRAPES) ×2 IMPLANT
NEEDLE INSUFFLATION 120MM (ENDOMECHANICALS) ×2 IMPLANT
PACK LAPAROSCOPY BASIN (CUSTOM PROCEDURE TRAY) ×1 IMPLANT
PACK LAVH (CUSTOM PROCEDURE TRAY) ×2 IMPLANT
PAD PREP 24X48 CUFFED NSTRL (MISCELLANEOUS) ×4 IMPLANT
PLUG CATH AND CAP STER (CATHETERS) ×2 IMPLANT
POUCH SPECIMEN RETRIEVAL 10MM (ENDOMECHANICALS) IMPLANT
PROTECTOR NERVE ULNAR (MISCELLANEOUS) ×4 IMPLANT
RUMI II 3.0CM BLUE KOH-EFFICIE (DISPOSABLE) IMPLANT
RUMI II GYRUS 2.5CM BLUE (DISPOSABLE) IMPLANT
RUMI II GYRUS 3.5CM BLUE (DISPOSABLE) IMPLANT
RUMI II GYRUS 4.0CM BLUE (DISPOSABLE) IMPLANT
SET CYSTO W/LG BORE CLAMP LF (SET/KITS/TRAYS/PACK) IMPLANT
SET IRRIG TUBING LAPAROSCOPIC (IRRIGATION / IRRIGATOR) ×2 IMPLANT
SOLUTION ELECTROLUBE (MISCELLANEOUS) ×2 IMPLANT
SUT VIC AB 0 CT1 27 (SUTURE) ×4
SUT VIC AB 0 CT1 27XBRD ANBCTR (SUTURE) ×2 IMPLANT
SUT VIC AB 0 CT1 27XBRD ANTBC (SUTURE) IMPLANT
SUT VICRYL 0 UR6 27IN ABS (SUTURE) ×2 IMPLANT
SUT VICRYL 4-0 PS2 18IN ABS (SUTURE) ×4 IMPLANT
SUT VICRYL RAPIDE 4/0 PS 2 (SUTURE) ×4 IMPLANT
SYR 50ML LL SCALE MARK (SYRINGE) ×2 IMPLANT
SYRINGE 10CC LL (SYRINGE) ×2 IMPLANT
SYSTEM CONVERTIBLE TROCAR (TROCAR) IMPLANT
TIP UTERINE 5.1X6CM LAV DISP (MISCELLANEOUS) IMPLANT
TIP UTERINE 6.7X10CM GRN DISP (MISCELLANEOUS) IMPLANT
TIP UTERINE 6.7X6CM WHT DISP (MISCELLANEOUS) ×1 IMPLANT
TIP UTERINE 6.7X8CM BLUE DISP (MISCELLANEOUS) ×1 IMPLANT
TOWEL OR 17X24 6PK STRL BLUE (TOWEL DISPOSABLE) ×6 IMPLANT
TRAY FOLEY CATH 14FR (SET/KITS/TRAYS/PACK) ×2 IMPLANT
TROCAR BLADELESS OPT 12M 100M (ENDOMECHANICALS) IMPLANT
TROCAR DILATING TIP 12MM 150MM (ENDOMECHANICALS) ×2 IMPLANT
TROCAR DISP BLADELESS 8 DVNC (TROCAR) ×1 IMPLANT
TROCAR DISP BLADELESS 8MM (TROCAR) ×1
TROCAR OPTI TIP 5M 100M (ENDOMECHANICALS) ×1 IMPLANT
TROCAR XCEL 12X100 BLDLESS (ENDOMECHANICALS) IMPLANT
TROCAR XCEL DIL TIP R 11M (ENDOMECHANICALS) ×1 IMPLANT
TROCAR XCEL NON-BLD 5MMX100MML (ENDOMECHANICALS) ×2 IMPLANT
TROCAR XCEL OPT SLVE 5M 100M (ENDOMECHANICALS) IMPLANT
TUBING FILTER THERMOFLATOR (ELECTROSURGICAL) ×2 IMPLANT
WARMER LAPAROSCOPE (MISCELLANEOUS) ×2 IMPLANT
WATER STERILE IRR 1000ML POUR (IV SOLUTION) ×6 IMPLANT

## 2013-01-06 NOTE — Preoperative (Signed)
Beta Blockers   Reason not to administer Beta Blockers:Not Applicable 

## 2013-01-06 NOTE — Transfer of Care (Signed)
Immediate Anesthesia Transfer of Care Note  Patient: Amber Pope  Procedure(s) Performed: Procedure(s): Robotic-Assisted Laparoscopic Lysis of Adhesions (N/A) Robotic-Assisted Resection of Endometriosis (N/A)  Patient Location: PACU  Anesthesia Type:General  Level of Consciousness: awake and alert   Airway & Oxygen Therapy: Patient Spontanous Breathing and Patient connected to nasal cannula oxygen  Post-op Assessment: Report given to PACU RN and Post -op Vital signs reviewed and stable  Post vital signs: Reviewed and stable  Complications: No apparent anesthesia complications

## 2013-01-06 NOTE — Op Note (Signed)
NAME:  Amber Pope, Amber Pope NO.:  [id number]  MEDICAL RECORD NO.:  [id number]  LOCATION:  [facility]                          FACILITY:  [facility]  PHYSICIAN:  Lenoard Aden, M.D.DATE OF BIRTH:  10/13/1986  DATE OF PROCEDURE:  01/06/2013 DATE OF DISCHARGE:  01/06/2013                              OPERATIVE REPORT   DESCRIPTION OF PROCEDURE:  After being apprised of the risks of anesthesia, infection, bleeding, injury to abdominal organs, possible need for repair, delayed versus immediate complications to include bowel and bladder injury, possible need for repair, the patient was brought to the operating room where she was administered general anesthetic without complications.  Prepped and draped in usual sterile fashion.  Foley catheter placed.  Feet were placed in Yellofin stirrups.  Exam under anesthesia reveals a anteflexed uterus, slightly bulky, and no adnexal masses.  At this time, the RUMI retractor kit is placed in the standard fashion.  Infraumbilical incision made with scalpel.  Veress needle placed, opening pressure -2, 3 L of CO2 insufflated without difficulty. A 12 mm trocar placed.  Visualization reveals normal liver, gallbladder bed, atraumatic trocar entry, normal appendiceal area.  The uterus has what appears to be a subserosal left fundal fibroid which looks to be approximately 2-3 cm.  The left tube appears normal.  The left ovary has superficial implants of endometriosis.  There are adhesions of the left bowel reflection to the left adnexa.  The right ovary was adhesed to the ovarian fossa with evidence of a chocolate cyst adhesing into the peritoneum with peritoneal implants cephalad to the position of the ureter noted along the peritoneal side wall.  There are peritoneal implants on the bladder flap.  There is no cul-de-sac disease.  At this time, decision was made to proceed robotically for precise dissection and resection of endometriosis.  The robotic  ports were placed, 2 ports on the left, 1 on the left, 1 on the right, and a 5-mm port on the left. For an assistant port, the robot was docked after achieving steep Trendelenburg position in a standard fashion.  At this time, PK forceps and Endo Shears were entered.  The adhesions along the bowel along the left adnexa were lysed sharply with sharp dissection using the Endo Shears with adhesiolysis accomplished without difficulty.  The left superficial ovarian endometriotic implants were cauterized using the Endo Shears.  The anterior cul-de-sac, peritoneal, superficial endometriosis was cauterized using the Endo Shears, pictures taken. Attention was turned to the more extensive disease along the right adnexa whereby the right ovary was then elevated.  The endometrioma was opened, excised, irrigation.  The cyst cavity was then cauterized and the right ovary was completely freed with resection of endometrioma from the right superior pole.  The right tube appears normal. At this time, the peritoneum was opened cephalad to the course of the right ureter. Hydrodissection was performed.  At this point, the peritoneal endometriosis on the right master's window were dissected, undermined in their entirety and removed.  These peritoneal implants were sent for confirmation.  Good hemostasis was noted after excision of the right endometrioma and removal of this peritoneal disease.  At this time, the robot was undocked and  Interceed was placed along the right sidewall where the peritoneal implant was removed and the right ovary was wrapped in Interceed as well.  Good hemostasis was assured.  All instruments removed under direct visualization.  CO2 was released.  Positive pressure applied.  Incisions were closed using 0 Vicryl, 4-0 Vicryl, and Dermabond.  The RUMI retractor and Foley catheter were removed.  The patient tolerated the procedure well, was awakened, and transferred to recovery in good  condition.     Lenoard Aden, M.D.     RJT/MEDQ  D:  01/06/2013  T:  01/06/2013  Job:  [id number]

## 2013-01-06 NOTE — Anesthesia Preprocedure Evaluation (Signed)
Anesthesia Evaluation  Patient identified by MRN, date of birth, ID band Patient awake    Reviewed: Allergy & Precautions, H&P , Patient's Chart, lab work & pertinent test results, reviewed documented beta blocker date and time   Airway Mallampati: II TM Distance: >3 FB Neck ROM: full    Dental no notable dental hx.    Pulmonary neg pulmonary ROS, asthma ,  breath sounds clear to auscultation  Pulmonary exam normal       Cardiovascular Exercise Tolerance: Good negative cardio ROS  Rhythm:regular Rate:Normal     Neuro/Psych PSYCHIATRIC DISORDERS negative neurological ROS  negative psych ROS   GI/Hepatic negative GI ROS, Neg liver ROS, GERD-  ,  Endo/Other  negative endocrine ROS  Renal/GU Renal diseasenegative Renal ROS     Musculoskeletal   Abdominal   Peds  Hematology negative hematology ROS (+)   Anesthesia Other Findings GERD (gastroesophageal reflux disease)     Anxiety        Asthma     Complication of anesthesia   tends to experience "excessive hysteria" post op    Renal calculus     Depression        Panic disorder    Reproductive/Obstetrics negative OB ROS                           Anesthesia Physical Anesthesia Plan  ASA: II  Anesthesia Plan: General ETT   Post-op Pain Management:    Induction:   Airway Management Planned:   Additional Equipment:   Intra-op Plan:   Post-operative Plan:   Informed Consent: I have reviewed the patients History and Physical, chart, labs and discussed the procedure including the risks, benefits and alternatives for the proposed anesthesia with the patient or authorized representative who has indicated his/her understanding and acceptance.   Dental Advisory Given  Plan Discussed with: CRNA and Surgeon  Anesthesia Plan Comments:         Anesthesia Quick Evaluation

## 2013-01-06 NOTE — Op Note (Signed)
01/06/2013  12:26 PM  PATIENT:  Amber Pope  27 y.o. female  PRE-OPERATIVE DIAGNOSIS:  Right Lower Quadrant Pain 40981  POST-OPERATIVE DIAGNOSIS:  Right Lower Quadrant Pain 19147  PROCEDURE:  Procedure(s): Robotic-Assisted Laparoscopic Lysis of Adhesions Robotic-Assisted Resection of Endometriosis(removal of peritoneal implants) Excision of right ovarian endometrioma Ablation of endometriosis  SURGEON:  Surgeon(s): Lenoard Aden, MD  ASSISTANTS: Fredric Mare, CNM   ANESTHESIA:   local and general  ESTIMATED BLOOD LOSS: *25cc  DRAINS: none   LOCAL MEDICATIONS USED:  MARCAINE     SPECIMEN:  Source of Specimen:  peritoneal implants  DISPOSITION OF SPECIMEN:  PATHOLOGY  COUNTS:  YES  DICTATION #: [id number]  PLAN OF CARE: DC home  PATIENT DISPOSITION:  PACU - hemodynamically stable.

## 2013-01-06 NOTE — Progress Notes (Signed)
Patient ID: Amber Pope, female   DOB: [birth year], 27 y.o.   MRN: [id number] Patient seen and examined. Consent witnessed and signed. No changes noted. Update completed.

## 2013-01-06 NOTE — Anesthesia Postprocedure Evaluation (Signed)
  Anesthesia Post Note  Patient: Amber Pope  Procedure(s) Performed: Procedure(s) (LRB): Robotic-Assisted Laparoscopic Lysis of Adhesions (N/A) Robotic-Assisted Resection of Endometriosis (N/A)  Anesthesia type: GA  Patient location: PACU  Post pain: Pain level controlled  Post assessment: Post-op Vital signs reviewed  Last Vitals:  Filed Vitals:   01/06/13 1234  BP: 134/93  Pulse: 96  Temp: 37 C  Resp: 18    Post vital signs: Reviewed  Level of consciousness: sedated  Complications: No apparent anesthesia complications

## 2013-01-07 ENCOUNTER — Encounter (HOSPITAL_COMMUNITY): Payer: Self-pay | Admitting: Obstetrics and Gynecology

## 2013-01-07 LAB — MRSA CULTURE

## 2015-05-21 ENCOUNTER — Encounter: Payer: Self-pay | Admitting: Internal Medicine

## 2015-10-11 HISTORY — PX: TENDON RECONSTRUCTION: SHX2487

## 2016-08-21 ENCOUNTER — Emergency Department (HOSPITAL_COMMUNITY)
Admission: EM | Admit: 2016-08-21 | Discharge: 2016-08-21 | Disposition: A | Discharge status: Home / Self Care | Payer: Self-pay | Attending: Emergency Medicine | Admitting: Emergency Medicine

## 2016-08-21 ENCOUNTER — Encounter (HOSPITAL_COMMUNITY): Payer: Self-pay | Admitting: Emergency Medicine

## 2016-08-21 DIAGNOSIS — K920 Hematemesis: Secondary | ICD-10-CM | POA: Insufficient documentation

## 2016-08-21 DIAGNOSIS — F17200 Nicotine dependence, unspecified, uncomplicated: Secondary | ICD-10-CM | POA: Insufficient documentation

## 2016-08-21 DIAGNOSIS — K921 Melena: Secondary | ICD-10-CM | POA: Insufficient documentation

## 2016-08-21 DIAGNOSIS — Z5321 Procedure and treatment not carried out due to patient leaving prior to being seen by health care provider: Secondary | ICD-10-CM | POA: Insufficient documentation

## 2016-08-21 DIAGNOSIS — J45909 Unspecified asthma, uncomplicated: Secondary | ICD-10-CM | POA: Insufficient documentation

## 2016-08-21 LAB — COMPREHENSIVE METABOLIC PANEL
ALK PHOS: 70 U/L (ref 38–126)
ALT: 5 U/L — AB (ref 14–54)
AST: 14 U/L — AB (ref 15–41)
Albumin: 4.2 g/dL (ref 3.5–5.0)
Anion gap: 7 (ref 5–15)
CALCIUM: 9.4 mg/dL (ref 8.9–10.3)
CHLORIDE: 103 mmol/L (ref 101–111)
CO2: 23 mmol/L (ref 22–32)
CREATININE: 0.69 mg/dL (ref 0.44–1.00)
GFR calc non Af Amer: >60 mL/min (ref >60)
GLUCOSE: 69 mg/dL (ref 65–99)
Potassium: 3.6 mmol/L (ref 3.5–5.1)
SODIUM: 133 mmol/L — AB (ref 135–145)
Total Bilirubin: 0.1 mg/dL — ABNORMAL LOW (ref 0.3–1.2)
Total Protein: 6.8 g/dL (ref 6.5–8.1)

## 2016-08-21 LAB — CBC WITH DIFFERENTIAL/PLATELET
BASOS ABS: 0 10*3/uL (ref 0.0–0.1)
Basophils Relative: 0 %
EOS ABS: 0.1 10*3/uL (ref 0.0–0.7)
Eosinophils Relative: 2 %
HCT: 40.2 % (ref 36.0–46.0)
HEMOGLOBIN: 14.2 g/dL (ref 12.0–15.0)
LYMPHS ABS: 3 10*3/uL (ref 0.7–4.0)
LYMPHS PCT: 41 %
MCH: 32.6 pg (ref 26.0–34.0)
MCHC: 35.3 g/dL (ref 30.0–36.0)
MCV: 92.4 fL (ref 78.0–100.0)
Monocytes Absolute: 0.5 10*3/uL (ref 0.1–1.0)
Monocytes Relative: 7 %
NEUTROS PCT: 50 %
Neutro Abs: 3.6 10*3/uL (ref 1.7–7.7)
PLATELETS: 280 10*3/uL (ref 150–400)
RBC: 4.35 MIL/uL (ref 3.87–5.11)
RDW: 11.9 % (ref 11.5–15.5)
WBC: 7.2 10*3/uL (ref 4.0–10.5)

## 2016-08-21 NOTE — ED Notes (Signed)
Pt not willing to wait any longer.

## 2016-08-21 NOTE — ED Triage Notes (Signed)
Pt c/o abd pain in 3 different areas. St's onset 72 hours ago.  Pt also st's she is vomiting trash cans full of blood.  Also c/o bloody stools.  Skin warm and dry, color appropriate

## 2016-09-03 LAB — OB RESULTS CONSOLE HIV ANTIBODY (ROUTINE TESTING): HIV: NONREACTIVE

## 2016-09-03 LAB — OB RESULTS CONSOLE ANTIBODY SCREEN: ANTIBODY SCREEN: NEGATIVE

## 2016-09-03 LAB — OB RESULTS CONSOLE HEPATITIS B SURFACE ANTIGEN: Hepatitis B Surface Ag: NEGATIVE

## 2016-09-03 LAB — OB RESULTS CONSOLE ABO/RH: RH TYPE: NEGATIVE

## 2016-09-03 LAB — OB RESULTS CONSOLE RUBELLA ANTIBODY, IGM: RUBELLA: IMMUNE

## 2016-10-26 LAB — OB RESULTS CONSOLE GC/CHLAMYDIA
Chlamydia: NEGATIVE
Gonorrhea: NEGATIVE

## 2016-11-10 NOTE — L&D Delivery Note (Signed)
Delivery Note At 5:27 PM a viable and healthy female was delivered via Vaginal, Spontaneous Delivery (Presentation: LOA).  APGAR: 9, 9; weight pending .   Placenta status: spontaneous, intact.  Cord:  with the following complications: none.  Cord pH: na  Anesthesia:  epidural Episiotomy: None Lacerations: 2nd degree Suture Repair: 2.0 vicryl rapide Est. Blood Loss (mL):  150  Mom to postpartum.  Baby to Couplet care / Skin to Skin.  Octivia Canion J 03/30/2017, 5:45 PM

## 2017-01-09 LAB — OB RESULTS CONSOLE RPR: RPR: NONREACTIVE

## 2017-03-03 LAB — OB RESULTS CONSOLE GBS: STREP GROUP B AG: NEGATIVE

## 2017-03-27 ENCOUNTER — Other Ambulatory Visit: Payer: Self-pay | Admitting: Obstetrics and Gynecology

## 2017-03-30 ENCOUNTER — Inpatient Hospital Stay (HOSPITAL_COMMUNITY): Payer: BLUE CROSS/BLUE SHIELD | Admitting: Anesthesiology

## 2017-03-30 ENCOUNTER — Inpatient Hospital Stay (HOSPITAL_COMMUNITY)
Admission: AD | Admit: 2017-03-30 | Discharge: 2017-04-01 | DRG: 774 | Disposition: A | Discharge status: Home / Self Care | Payer: BLUE CROSS/BLUE SHIELD | Source: Ambulatory Visit | Attending: Obstetrics and Gynecology | Admitting: Obstetrics and Gynecology

## 2017-03-30 ENCOUNTER — Encounter (HOSPITAL_COMMUNITY): Payer: Self-pay | Admitting: Obstetrics

## 2017-03-30 DIAGNOSIS — Z3493 Encounter for supervision of normal pregnancy, unspecified, third trimester: Secondary | ICD-10-CM | POA: Diagnosis present

## 2017-03-30 DIAGNOSIS — O99334 Smoking (tobacco) complicating childbirth: Principal | ICD-10-CM | POA: Diagnosis present

## 2017-03-30 DIAGNOSIS — Z6791 Unspecified blood type, Rh negative: Secondary | ICD-10-CM | POA: Diagnosis not present

## 2017-03-30 DIAGNOSIS — D62 Acute posthemorrhagic anemia: Secondary | ICD-10-CM | POA: Diagnosis not present

## 2017-03-30 DIAGNOSIS — K5900 Constipation, unspecified: Secondary | ICD-10-CM | POA: Diagnosis present

## 2017-03-30 DIAGNOSIS — F17200 Nicotine dependence, unspecified, uncomplicated: Secondary | ICD-10-CM | POA: Diagnosis present

## 2017-03-30 DIAGNOSIS — O26893 Other specified pregnancy related conditions, third trimester: Secondary | ICD-10-CM | POA: Diagnosis present

## 2017-03-30 DIAGNOSIS — Z3A39 39 weeks gestation of pregnancy: Secondary | ICD-10-CM | POA: Diagnosis not present

## 2017-03-30 DIAGNOSIS — O9081 Anemia of the puerperium: Secondary | ICD-10-CM | POA: Diagnosis not present

## 2017-03-30 DIAGNOSIS — O9089 Other complications of the puerperium, not elsewhere classified: Secondary | ICD-10-CM | POA: Diagnosis present

## 2017-03-30 HISTORY — DX: Complex regional pain syndrome I, unspecified: G90.50

## 2017-03-30 LAB — CBC
HCT: 34.8 % — ABNORMAL LOW (ref 36.0–46.0)
HEMOGLOBIN: 11.9 g/dL — AB (ref 12.0–15.0)
MCH: 30.1 pg (ref 26.0–34.0)
MCHC: 34.2 g/dL (ref 30.0–36.0)
MCV: 88.1 fL (ref 78.0–100.0)
Platelets: 268 10*3/uL (ref 150–400)
RBC: 3.95 MIL/uL (ref 3.87–5.11)
RDW: 13.1 % (ref 11.5–15.5)
WBC: 10.8 10*3/uL — ABNORMAL HIGH (ref 4.0–10.5)

## 2017-03-30 LAB — TYPE AND SCREEN
ABO/RH(D): AB NEG
ANTIBODY SCREEN: NEGATIVE

## 2017-03-30 LAB — ABO/RH: ABO/RH(D): AB NEG

## 2017-03-30 MED ORDER — LACTATED RINGERS IV SOLN: 500.0000 mL | Freq: Once | INTRAVENOUS | Status: DC

## 2017-03-30 MED ORDER — ACETAMINOPHEN 325 MG PO TABS: 650.0000 mg | ORAL_TABLET | ORAL | Status: DC | PRN

## 2017-03-30 MED ORDER — LACTATED RINGERS IV SOLN: 500.0000 mL | INTRAVENOUS | Status: DC | PRN

## 2017-03-30 MED ORDER — OXYTOCIN BOLUS FROM INFUSION
500.0000 mL | Freq: Once | INTRAVENOUS | Status: AC
  Administered 2017-03-30: 500 mL via INTRAVENOUS

## 2017-03-30 MED ORDER — OXYTOCIN 40 UNITS IN LACTATED RINGERS INFUSION - SIMPLE MED: 2.5000 [IU]/h | INTRAVENOUS | Status: DC

## 2017-03-30 MED ORDER — OXYCODONE-ACETAMINOPHEN 5-325 MG PO TABS
1.0000 | ORAL_TABLET | ORAL | Status: DC | PRN
  Administered 2017-03-30 – 2017-04-01 (×7): 1 via ORAL
  Filled 2017-03-30 (×8): qty 1

## 2017-03-30 MED ORDER — SENNOSIDES-DOCUSATE SODIUM 8.6-50 MG PO TABS
2.0000 | ORAL_TABLET | ORAL | Status: DC
  Administered 2017-03-30 – 2017-03-31 (×2): 2 via ORAL
  Filled 2017-03-30 (×2): qty 2

## 2017-03-30 MED ORDER — DIBUCAINE 1 % RE OINT: 1.0000 "application " | TOPICAL_OINTMENT | RECTAL | Status: DC | PRN

## 2017-03-30 MED ORDER — DIPHENHYDRAMINE HCL 25 MG PO CAPS: 25.0000 mg | ORAL_CAPSULE | Freq: Four times a day (QID) | ORAL | Status: DC | PRN

## 2017-03-30 MED ORDER — ONDANSETRON HCL 4 MG PO TABS
4.0000 mg | ORAL_TABLET | ORAL | Status: DC | PRN
  Administered 2017-03-31 – 2017-04-01 (×7): 4 mg via ORAL
  Filled 2017-03-30 (×8): qty 1

## 2017-03-30 MED ORDER — FENTANYL 2.5 MCG/ML BUPIVACAINE 1/10 % EPIDURAL INFUSION (WH - ANES)
14.0000 mL/h | INTRAMUSCULAR | Status: DC | PRN
  Administered 2017-03-30: 14 mL/h via EPIDURAL
  Filled 2017-03-30: qty 100

## 2017-03-30 MED ORDER — ZOLPIDEM TARTRATE 5 MG PO TABS: 5.0000 mg | ORAL_TABLET | Freq: Every evening | ORAL | Status: DC | PRN

## 2017-03-30 MED ORDER — WITCH HAZEL-GLYCERIN EX PADS
1.0000 | MEDICATED_PAD | CUTANEOUS | Status: DC | PRN
Start: 2017-03-30 — End: 2017-04-01

## 2017-03-30 MED ORDER — LIDOCAINE HCL (PF) 1 % IJ SOLN
30.0000 mL | INTRAMUSCULAR | Status: DC | PRN
  Filled 2017-03-30: qty 30

## 2017-03-30 MED ORDER — LIDOCAINE HCL (PF) 1 % IJ SOLN
INTRAMUSCULAR | Status: DC | PRN
  Administered 2017-03-30: 13 mL via EPIDURAL

## 2017-03-30 MED ORDER — TERBUTALINE SULFATE 1 MG/ML IJ SOLN
0.2500 mg | Freq: Once | INTRAMUSCULAR | Status: DC | PRN
  Filled 2017-03-30: qty 1

## 2017-03-30 MED ORDER — TETANUS-DIPHTH-ACELL PERTUSSIS 5-2.5-18.5 LF-MCG/0.5 IM SUSP: 0.5000 mL | Freq: Once | INTRAMUSCULAR | Status: DC

## 2017-03-30 MED ORDER — OXYCODONE-ACETAMINOPHEN 5-325 MG PO TABS: 2.0000 | ORAL_TABLET | ORAL | Status: DC | PRN

## 2017-03-30 MED ORDER — METHYLERGONOVINE MALEATE 0.2 MG/ML IJ SOLN: 0.2000 mg | INTRAMUSCULAR | Status: DC | PRN

## 2017-03-30 MED ORDER — BENZOCAINE-MENTHOL 20-0.5 % EX AERO
1.0000 "application " | INHALATION_SPRAY | CUTANEOUS | Status: DC | PRN
  Filled 2017-03-30: qty 56

## 2017-03-30 MED ORDER — ONDANSETRON HCL 4 MG/2ML IJ SOLN: 4.0000 mg | Freq: Four times a day (QID) | INTRAMUSCULAR | Status: DC | PRN

## 2017-03-30 MED ORDER — SIMETHICONE 80 MG PO CHEW: 80.0000 mg | CHEWABLE_TABLET | ORAL | Status: DC | PRN

## 2017-03-30 MED ORDER — OXYCODONE-ACETAMINOPHEN 5-325 MG PO TABS: 1.0000 | ORAL_TABLET | ORAL | Status: DC | PRN

## 2017-03-30 MED ORDER — SOD CITRATE-CITRIC ACID 500-334 MG/5ML PO SOLN: 30.0000 mL | ORAL | Status: DC | PRN

## 2017-03-30 MED ORDER — ONDANSETRON HCL 4 MG/2ML IJ SOLN
4.0000 mg | INTRAMUSCULAR | Status: DC | PRN
  Administered 2017-03-30: 4 mg via INTRAVENOUS
  Filled 2017-03-30: qty 2

## 2017-03-30 MED ORDER — EPHEDRINE 5 MG/ML INJ
10.0000 mg | INTRAVENOUS | Status: DC | PRN
  Filled 2017-03-30: qty 2

## 2017-03-30 MED ORDER — OXYTOCIN 40 UNITS IN LACTATED RINGERS INFUSION - SIMPLE MED
1.0000 m[IU]/min | INTRAVENOUS | Status: DC
  Administered 2017-03-30: 2 m[IU]/min via INTRAVENOUS
  Filled 2017-03-30: qty 1000

## 2017-03-30 MED ORDER — LACTATED RINGERS IV SOLN: INTRAVENOUS | Status: DC

## 2017-03-30 MED ORDER — METHYLERGONOVINE MALEATE 0.2 MG PO TABS: 0.2000 mg | ORAL_TABLET | ORAL | Status: DC | PRN

## 2017-03-30 MED ORDER — OXYTOCIN 40 UNITS IN LACTATED RINGERS INFUSION - SIMPLE MED: 1.0000 m[IU]/min | INTRAVENOUS | Status: DC

## 2017-03-30 MED ORDER — PHENYLEPHRINE 40 MCG/ML (10ML) SYRINGE FOR IV PUSH (FOR BLOOD PRESSURE SUPPORT)
80.0000 ug | PREFILLED_SYRINGE | INTRAVENOUS | Status: DC | PRN
  Filled 2017-03-30: qty 5

## 2017-03-30 MED ORDER — DIPHENHYDRAMINE HCL 50 MG/ML IJ SOLN: 12.5000 mg | INTRAMUSCULAR | Status: DC | PRN

## 2017-03-30 MED ORDER — PRENATAL MULTIVITAMIN CH
1.0000 | ORAL_TABLET | Freq: Every day | ORAL | Status: DC
  Administered 2017-04-01: 1 via ORAL
  Filled 2017-03-30: qty 1

## 2017-03-30 MED ORDER — PHENYLEPHRINE 40 MCG/ML (10ML) SYRINGE FOR IV PUSH (FOR BLOOD PRESSURE SUPPORT)
80.0000 ug | PREFILLED_SYRINGE | INTRAVENOUS | Status: DC | PRN
  Filled 2017-03-30: qty 5
  Filled 2017-03-30: qty 10

## 2017-03-30 MED ORDER — IBUPROFEN 600 MG PO TABS
600.0000 mg | ORAL_TABLET | Freq: Four times a day (QID) | ORAL | Status: DC
  Administered 2017-03-30 – 2017-04-01 (×5): 600 mg via ORAL
  Filled 2017-03-30 (×7): qty 1

## 2017-03-30 MED ORDER — ACETAMINOPHEN 325 MG PO TABS
650.0000 mg | ORAL_TABLET | ORAL | Status: DC | PRN
  Filled 2017-03-30: qty 2

## 2017-03-30 MED ORDER — COCONUT OIL OIL
1.0000 "application " | TOPICAL_OIL | Status: DC | PRN
  Filled 2017-03-30: qty 120

## 2017-03-30 NOTE — Anesthesia Preprocedure Evaluation (Signed)
Anesthesia Evaluation  Patient identified by MRN, date of birth, ID band Patient awake    Reviewed: Allergy & Precautions, H&P , Patient's Chart, lab work & pertinent test results, reviewed documented beta blocker date and time   History of Anesthesia Complications (+) history of anesthetic complications  Airway Mallampati: II  TM Distance: >3 FB Neck ROM: full    Dental no notable dental hx.    Pulmonary neg pulmonary ROS, asthma , former smoker,    Pulmonary exam normal breath sounds clear to auscultation       Cardiovascular Exercise Tolerance: Good negative cardio ROS   Rhythm:regular Rate:Normal     Neuro/Psych PSYCHIATRIC DISORDERS negative neurological ROS  negative psych ROS   GI/Hepatic negative GI ROS, Neg liver ROS, GERD  ,  Endo/Other  negative endocrine ROS  Renal/GU Renal diseasenegative Renal ROS     Musculoskeletal   Abdominal   Peds  Hematology negative hematology ROS (+)   Anesthesia Other Findings GERD (gastroesophageal reflux disease)     Anxiety        Asthma     Complication of anesthesia   tends to experience "excessive hysteria" post op    Renal calculus     Depression        Panic disorder    Reproductive/Obstetrics negative OB ROS (+) Pregnancy                             Anesthesia Physical  Anesthesia Plan  ASA: II  Anesthesia Plan: Epidural   Post-op Pain Management:    Induction:   Airway Management Planned:   Additional Equipment:   Intra-op Plan:   Post-operative Plan:   Informed Consent: I have reviewed the patients History and Physical, chart, labs and discussed the procedure including the risks, benefits and alternatives for the proposed anesthesia with the patient or authorized representative who has indicated his/her understanding and acceptance.   Dental Advisory Given  Plan Discussed with: CRNA and Surgeon  Anesthesia Plan  Comments:         Anesthesia Quick Evaluation

## 2017-03-30 NOTE — Anesthesia Pain Management Evaluation Note (Signed)
  CRNA Pain Management Visit Note  Patient: Amber Pope, 31 y.o., female  "Hello I am a member of the anesthesia team at [facility]. We have an anesthesia team available at all times to provide care throughout the hospital, including epidural management and anesthesia for C-section. I don't know your plan for the delivery whether it a natural birth, water birth, IV sedation, nitrous supplementation, doula or epidural, but we want to meet your pain goals."   1.Was your pain managed to your expectations on prior hospitalizations?   No prior hospitalizations  2.What is your expectation for pain management during this hospitalization?     Epidural  3.How can we help you reach that goal? unsure  Record the patient's initial score and the patient's pain goal.   Pain: 5  Pain Goal: 8 [facility] wants you to be able to say your pain was always managed very well.  Casimer Lanius 03/30/2017

## 2017-03-30 NOTE — Anesthesia Procedure Notes (Signed)
Epidural Patient location during procedure: OB Start time: 03/30/2017 12:08 PM End time: 03/30/2017 12:27 PM  Staffing Anesthesiologist: Candida Peeling RAY Performed: anesthesiologist   Preanesthetic Checklist Completed: patient identified, site marked, surgical consent, pre-op evaluation, timeout performed, IV checked, risks and benefits discussed and monitors and equipment checked  Epidural Patient position: sitting Prep: DuraPrep Patient monitoring: heart rate, cardiac monitor, continuous pulse ox and blood pressure Approach: midline Location: L2-L3 Injection technique: LOR saline  Needle:  Needle type: Tuohy  Needle gauge: 17 G Needle length: 9 cm Needle insertion depth: 6 cm Catheter type: closed end flexible Catheter size: 20 Guage Catheter at skin depth: 9 cm Test dose: negative  Assessment Events: blood not aspirated, injection not painful, no injection resistance, negative IV test and no paresthesia  Additional Notes Reason for block:procedure for pain

## 2017-03-30 NOTE — H&P (Signed)
Amber Pope is a 31 y.o. female presenting for SROM and labor. OB History    Gravida Para Term Preterm AB Living   1             SAB TAB Ectopic Multiple Live Births                 Past Medical History:  Diagnosis Date  . Anxiety   . Asthma   . Complication of anesthesia    tends to experience "excessive hysteria" post op  . Depression   . GERD (gastroesophageal reflux disease)   . Panic disorder   . Renal calculus    Past Surgical History:  Procedure Laterality Date  . ABLATION ON ENDOMETRIOSIS N/A 01/06/2013   Procedure: Robotic-Assisted Resection of Endometriosis;  Surgeon: Lovenia Kim, MD;  Location: [facility];  Service: Gynecology;  Laterality: N/A;  . kidney stent placement     with later removal  . LITHOTRIPSY     x 2  . ROBOTIC ASSISTED LAPAROSCOPIC LYSIS OF ADHESION N/A 01/06/2013   Procedure: Robotic-Assisted Laparoscopic Lysis of Adhesions;  Surgeon: Lovenia Kim, MD;  Location: [facility];  Service: Gynecology;  Laterality: N/A;   Family History: family history includes Barrett's esophagus in her father; Diabetes in her maternal grandfather and paternal grandfather; Esophageal cancer in her father; GER disease in her father and mother; Ulcers in her brother. Social History:  reports that she has been smoking.  She has never used smokeless tobacco. She reports that she drinks alcohol. She reports that she uses drugs, including Marijuana.     Maternal Diabetes: No Genetic Screening: Normal Maternal Ultrasounds/Referrals: Normal Fetal Ultrasounds or other Referrals:  None Maternal Substance Abuse:  No Significant Maternal Medications:  None Significant Maternal Lab Results:  None Other Comments:  None  Review of Systems  Constitutional: Negative.   All other systems reviewed and are negative.  Maternal Medical History:  Reason for admission: Rupture of membranes and contractions.   Contractions: Onset was 1-2 hours ago.   Frequency: regular.    Perceived severity is moderate.    Fetal activity: Perceived fetal activity is normal.   Last perceived fetal movement was within the past hour.    Prenatal complications: Preterm labor.   Prenatal Complications - Diabetes: none.      Blood pressure (!) 127/94, pulse (!) 107, temperature 98.5 F (36.9 C), temperature source Oral, resp. rate 18, height 5\' 4"  (1.626 m), weight 76.7 kg (169 lb), last menstrual period 06/21/2016, SpO2 99 %. Maternal Exam:  Uterine Assessment: Contraction strength is moderate.  Contraction frequency is irregular.   Abdomen: Patient reports no abdominal tenderness. Fetal presentation: vertex  Introitus: Normal vulva. Normal vagina.  Ferning test: positive.  Nitrazine test: positive. Amniotic fluid character: clear.  Pelvis: questionable for delivery.   Cervix: Cervix evaluated by digital exam.     Physical Exam  Nursing note and vitals reviewed. Constitutional: She is oriented to person, place, and time. She appears well-developed and well-nourished.  HENT:  Head: Normocephalic and atraumatic.  Neck: Normal range of motion. Neck supple.  Cardiovascular: Normal rate and regular rhythm.   Respiratory: Effort normal and breath sounds normal.  GI: Soft. Bowel sounds are normal.  Genitourinary: Vagina normal and uterus normal.  Musculoskeletal: Normal range of motion.  Neurological: She is alert and oriented to person, place, and time. She has normal reflexes.  Skin: Skin is warm and dry.  Psychiatric: She has a normal mood and affect.  Prenatal labs: ABO, Rh: --/--/AB NEG (05/21 1125) Antibody: PENDING (05/21 1125) Rubella: Immune (10/25 0000) RPR: Nonreactive (03/02 0000)  HBsAg: Negative (10/25 0000)  HIV: Non-reactive (10/25 0000)  GBS: Negative (04/24 0000)   Assessment/Plan: SROM at term CRPS Epidural  Augmentation prn    Xsavier Seeley J 03/30/2017, 12:33 PM

## 2017-03-31 ENCOUNTER — Encounter (HOSPITAL_COMMUNITY): Payer: Self-pay | Admitting: *Deleted

## 2017-03-31 LAB — CBC
HCT: 27.5 % — ABNORMAL LOW (ref 36.0–46.0)
Hemoglobin: 9.6 g/dL — ABNORMAL LOW (ref 12.0–15.0)
MCH: 30.7 pg (ref 26.0–34.0)
MCHC: 34.9 g/dL (ref 30.0–36.0)
MCV: 87.9 fL (ref 78.0–100.0)
PLATELETS: 207 10*3/uL (ref 150–400)
RBC: 3.13 MIL/uL — AB (ref 3.87–5.11)
RDW: 13.4 % (ref 11.5–15.5)
WBC: 13.1 10*3/uL — AB (ref 4.0–10.5)

## 2017-03-31 LAB — RPR: RPR Ser Ql: NONREACTIVE

## 2017-03-31 MED ORDER — FLEET ENEMA 7-19 GM/118ML RE ENEM
1.0000 | ENEMA | Freq: Every day | RECTAL | Status: DC | PRN
  Administered 2017-03-31: 1 via RECTAL
  Filled 2017-03-31: qty 1

## 2017-03-31 MED ORDER — POLYSACCHARIDE IRON COMPLEX 150 MG PO CAPS
150.0000 mg | ORAL_CAPSULE | Freq: Every day | ORAL | Status: DC
  Administered 2017-04-01: 150 mg via ORAL
  Filled 2017-03-31 (×2): qty 1

## 2017-03-31 MED ORDER — PNEUMOCOCCAL VAC POLYVALENT 25 MCG/0.5ML IJ INJ
0.5000 mL | INJECTION | INTRAMUSCULAR | Status: DC
  Filled 2017-03-31: qty 0.5

## 2017-03-31 MED ORDER — FAMOTIDINE 20 MG PO TABS
10.0000 mg | ORAL_TABLET | Freq: Two times a day (BID) | ORAL | Status: DC
  Administered 2017-03-31: 20 mg via ORAL
  Administered 2017-03-31 – 2017-04-01 (×2): 10 mg via ORAL
  Filled 2017-03-31 (×3): qty 1

## 2017-03-31 MED ORDER — MAGNESIUM OXIDE 400 (241.3 MG) MG PO TABS
400.0000 mg | ORAL_TABLET | Freq: Every day | ORAL | Status: DC
  Administered 2017-04-01: 400 mg via ORAL
  Filled 2017-03-31 (×3): qty 1

## 2017-03-31 NOTE — Progress Notes (Signed)
[name] was referred for history of depression/anxiety.  Referral is screened out by Clinical Social Worker because none of the following criteria appear to apply and  there are no reports impacting the pregnancy or her transition to the postpartum period. CSW does not deem it clinically necessary to further investigate at this time.  -History of anxiety/depression during this pregnancy, or of post-partum depression.  - Diagnosis of anxiety and/or depression within last 3 years.-  - History of depression due to pregnancy loss/loss of child or -[name]'s symptoms are currently being treated with medication and/or therapy.  Please contact the Clinical Social Worker if needs arise or upon [name] request.    Lane Hacker, MSW Clinical Social Work: System Wide Float Coverage for :  [phone]

## 2017-03-31 NOTE — Lactation Note (Signed)
This note was copied from a baby's chart. Lactation Consultation Note  Patient Name: Amber Pope [id number] Date: 03/31/2017 Reason for consult: Initial assessment  Baby is 37 hours old and has been to the breast, per mom last fed at 0630 for 60 mins on and off 5- 10 mins at a time over 60 mins.  LC changed a large wet and large stool .  LC assisted with latch on the left breast / football/ depth achieved, multiple swallows noted.  LC reviewed basics steps for latching and breast feeding with mom and dad.  Mom asked multiple of Breastfeeding questions and seemed anxious , tired and teary at times.  LC reassured her that the baby is doing well with latching, and it is great her colostrum is flowing so well.  Do to mom being anxious - Breast feeding review is indicated.  LC encouraged mom to get some rest and naps.  Mother informed of post-discharge support and given phone number to the lactation department, including services for phone call assistance; out-patient appointments; and breastfeeding support group. List of other breastfeeding resources in the community given in the handout. Encouraged mother to call for problems or concerns related to breastfeeding.   Maternal Data Has patient been taught Hand Expression?: Yes Does the patient have breastfeeding experience prior to this delivery?: No  Feeding Feeding Type: Breast Fed Length of feed: 20 min  LATCH Score/Interventions Latch: Grasps breast easily, tongue down, lips flanged, rhythmical sucking. Intervention(s): Skin to skin;Teach feeding cues;Waking techniques  Audible Swallowing: Spontaneous and intermittent  Type of Nipple: Everted at rest and after stimulation  Comfort (Breast/Nipple): Soft / non-tender     Hold (Positioning): Assistance needed to correctly position infant at breast and maintain latch. Intervention(s): Breastfeeding basics reviewed;Support Pillows;Position options  LATCH Score: 9  Lactation Tools  Discussed/Used     Consult Status Consult Status: Follow-up Date: 04/01/17 Follow-up type: In-patient    [place] 03/31/2017, 12:58 PM

## 2017-03-31 NOTE — Progress Notes (Signed)
PPD #1 SVD with 2nd degree laceration, baby boy   S:  Pt. Tearful upon arrival due to sleep deprivation and states she "feels like a bad mother" because the baby was spitting up last night and she feels like he needs to be watched constantly and expresses fears of the baby "being fragile"             Tolerating po/ No nausea or vomiting             Bleeding is light, moderate             Pain controlled with Motrin and Percocet             Up ad lib / ambulatory / voiding QS  Newborn breast feeding - has latches several times per patient / Circumcision done today  Very concerned about constipation as she states she likely has IBS-C and usually uses half of a Fleets enema to assist with bowel movements - she states she is starting to feel uncomfortable and would like a Fleets while she is here - she is also requesting a GI consult outpatient for IBS-C and GERD  O:               VS: BP 106/65   Pulse 67   Temp 97.6 F (36.4 C) (Oral)   Resp 18   Ht 5\' 4"  (1.626 m)   Wt 76.7 kg (169 lb)   LMP 06/21/2016   SpO2 100%   Breastfeeding? Unknown   BMI 29.01 kg/m    LABS:              Recent Labs  03/30/17 1125 03/31/17 0609  WBC 10.8* 13.1*  HGB 11.9* 9.6*  PLT 268 207               Blood type: --/--/AB NEG, AB NEG (05/21 1125), Baby A Negative   Rubella: Immune (10/25 0000)                     I&O: Intake/Output      05/21 0701 - 05/22 0700 05/22 0701 - 05/23 0700   Urine (mL/kg/hr) 1000    Blood 150    Total Output 1150     Net -1150                        Physical Exam:             Alert and oriented X3  Lungs: Clear and unlabored  Heart: regular rate and rhythm / no mumurs  Abdomen: soft, non-tender, non-distended              Fundus: firm, non-tender, U-2  Perineum: well approximated 2nd degree laceration healing well, no significant edema or erythema  Lochia: appropriate  Extremities: +1 BLE edema, no calf pain or tenderness    A: PPD # 1, SVD, 2nd degree  laceration  ABL Anemia - stable, asymptomatic  Doing well - stable status  RH negative, Baby A Negative - no Rhogam indicated   Constipation likely IBS-C  P: Routine post partum orders  Niferex 150mg  daily   Mag ox 400mg  daily  Fleets enema PRN for constipation at pt request  Reassurance and support given  Anticipate d/c home tomorrow  Lars Pinks, MSN, [facility]

## 2017-03-31 NOTE — Anesthesia Postprocedure Evaluation (Signed)
Anesthesia Post Note  Patient: Amber Pope  Procedure(s) Performed: * No procedures listed *  Patient location during evaluation: Mother Baby Anesthesia Type: Epidural Level of consciousness: awake Pain management: pain level controlled Vital Signs Assessment: post-procedure vital signs reviewed and stable Respiratory status: spontaneous breathing Cardiovascular status: stable Postop Assessment: no headache, epidural receding and patient able to bend at knees Anesthetic complications: no Comments: Pt reports all sensation is back but some tenderness at epidural site. Ambulating without problem. Reports some sensation of pain "shooting down her spine", although this just happened once and hasn't returned. Has hx of RSD in foot and has concerns of a flare up from the epidural. Reassurance given and told to call us if pain worsened. Told typically pain would resolve in 2-3 days. States motrin/percocet relieves discomfort.         Last Vitals:  Vitals:   03/31/17 0522 03/31/17 0533  BP: 122/68 106/65  Pulse: (!) 107 67  Resp: 18 18  Temp: 36.9 C 36.4 C    Last Pain:  Vitals:   03/31/17 0740  TempSrc:   PainSc: 4    Pain Goal:                 Everette Rank

## 2017-04-01 MED ORDER — FAMOTIDINE 10 MG PO CHEW: 10.0000 mg | CHEWABLE_TABLET | ORAL | 2 refills | Status: DC | PRN

## 2017-04-01 MED ORDER — OXYCODONE-ACETAMINOPHEN 5-325 MG PO TABS: 1.0000 | ORAL_TABLET | ORAL | 0 refills | Status: DC | PRN

## 2017-04-01 MED ORDER — MAGNESIUM OXIDE 400 (241.3 MG) MG PO TABS
400.0000 mg | ORAL_TABLET | Freq: Every day | ORAL | 4 refills | Status: DC
Start: 2017-04-02 — End: 2017-10-07

## 2017-04-01 MED ORDER — IBUPROFEN 600 MG PO TABS: 600.0000 mg | ORAL_TABLET | Freq: Three times a day (TID) | ORAL | 0 refills | Status: DC | PRN

## 2017-04-01 NOTE — Progress Notes (Signed)
Discharge education complete, prescriptions given, discharge instructions and follow up appointment discussed. Patient verbalized understanding.

## 2017-04-01 NOTE — Progress Notes (Signed)
PPD 2 SVD with 2nd degree repair  S:  Reports feeling sore all over - missed motrin dose and pain increased early AM today             Tolerating po/ No nausea or vomiting             Bleeding is moderate             Pain controlled with motrin and perococet             Up ad lib / ambulatory / voiding QS / + BM x 2 after Fleets  Newborn breast feeding   O:               VS: BP 116/69   Pulse 98   Temp 98.5 F (36.9 C) (Oral)   Resp 18   Ht 5\' 4"  (1.626 m)   Wt 76.7 kg (169 lb)   LMP 06/21/2016   SpO2 100%   Breastfeeding? Unknown   BMI 29.01 kg/m    LABS:              Recent Labs  03/30/17 1125 03/31/17 0609  WBC 10.8* 13.1*  HGB 11.9* 9.6*  PLT 268 207               Blood type: --/--/AB NEG, AB NEG (05/21 1125)/newborn A negative-no rhogam indicated  Rubella: Immune (10/25 0000)                                Physical Exam:             Alert and oriented X3  Abdomen: soft, non-tender, non-distended              Fundus: firm, non-tender, Ueven  Perineum: no edema  Lochia: light  Extremities: no edema, no calf pain or tenderness    A: PPD # 2   Doing well - stable status  P: Routine post partum orders  DC home - WOB booklet - instructions reviewed  Artelia Laroche CNM, MSN, [place] 04/01/2017, 10:41 AM

## 2017-04-01 NOTE — Lactation Note (Signed)
This note was copied from a baby's chart. Lactation Consultation Note  Patient Name: Amber Pope Date: 04/01/2017 Reason for consult: Follow-up assessment Mom states baby cluster fed during the night but won't wake up this AM.  Baby at breast wrapped in blanket.  Unwrapped and baby started to cue.  Baby latched easily and nursed actively.  Reviewed waking techniques and breast massage.  Many questions answered.  Reviewed engorgement treatment.  Lactation outpatient services and support information reviewed and encouraged.  Maternal Data    Feeding Feeding Type: Breast Fed Length of feed: 25 min  LATCH Score/Interventions Latch: Grasps breast easily, tongue down, lips flanged, rhythmical sucking. Intervention(s): Skin to skin;Teach feeding cues;Waking techniques  Audible Swallowing: A few with stimulation  Type of Nipple: Everted at rest and after stimulation  Comfort (Breast/Nipple): Soft / non-tender     Hold (Positioning): No assistance needed to correctly position infant at breast. Intervention(s): Breastfeeding basics reviewed;Support Pillows;Position options;Skin to skin  LATCH Score: 9  Lactation Tools Discussed/Used     Consult Status Consult Status: Complete    Ave Filter 04/01/2017, 12:21 PM

## 2017-04-07 ENCOUNTER — Inpatient Hospital Stay (HOSPITAL_COMMUNITY): Payer: BLUE CROSS/BLUE SHIELD

## 2017-04-16 NOTE — Discharge Summary (Signed)
Obstetric Discharge Summary Reason for Admission: onset of labor Prenatal Procedures: none Intrapartum Procedures: spontaneous vaginal delivery by Dr Ronita Hipps Postpartum Procedures: none Complications-Operative and Postpartum: 2nd degree perineal laceration Hemoglobin  Date Value Ref Range Status  03/31/2017 9.6 (L) 12.0 - 15.0 g/dL Final   HCT  Date Value Ref Range Status  03/31/2017 27.5 (L) 36.0 - 46.0 % Final    Physical Exam:  General: alert, cooperative, fatigued and no distress Lochia: appropriate Uterine Fundus: firm Incision: healing well DVT Evaluation: No evidence of DVT seen on physical exam.  Discharge Diagnoses: Term Pregnancy-delivered and ABL anemia  Discharge Information: Date: 04/16/2017 Activity: pelvic rest Diet: routine Medications: Tylenol #3, Ibuprofen, Iron and magnesium Condition: stable Instructions: refer to practice specific booklet Discharge to: home   Newborn Data: Live born female  Birth Weight: 7 lb 4.4 oz (3300 g) APGAR: 9, 9  Home with mother.  Amber Pope 04/16/2017, 11:16 AM

## 2017-06-08 ENCOUNTER — Ambulatory Visit: Payer: Self-pay | Admitting: Medical

## 2017-06-11 ENCOUNTER — Ambulatory Visit (INDEPENDENT_AMBULATORY_CARE_PROVIDER_SITE_OTHER): Payer: BLUE CROSS/BLUE SHIELD | Admitting: Medical

## 2017-06-11 VITALS — BP 122/80 | HR 79 | Resp 16 | Wt 148.8 lb

## 2017-06-11 DIAGNOSIS — Z800 Family history of malignant neoplasm of digestive organs: Secondary | ICD-10-CM | POA: Insufficient documentation

## 2017-06-11 DIAGNOSIS — K5900 Constipation, unspecified: Secondary | ICD-10-CM | POA: Diagnosis not present

## 2017-06-11 DIAGNOSIS — M545 Low back pain, unspecified: Secondary | ICD-10-CM | POA: Insufficient documentation

## 2017-06-11 DIAGNOSIS — L539 Erythematous condition, unspecified: Secondary | ICD-10-CM | POA: Diagnosis not present

## 2017-06-11 DIAGNOSIS — M79671 Pain in right foot: Secondary | ICD-10-CM

## 2017-06-11 DIAGNOSIS — G9050 Complex regional pain syndrome I, unspecified: Secondary | ICD-10-CM | POA: Diagnosis not present

## 2017-06-11 DIAGNOSIS — K257 Chronic gastric ulcer without hemorrhage or perforation: Secondary | ICD-10-CM

## 2017-06-11 DIAGNOSIS — K219 Gastro-esophageal reflux disease without esophagitis: Secondary | ICD-10-CM | POA: Diagnosis not present

## 2017-06-11 MED ORDER — PANTOPRAZOLE SODIUM 40 MG PO TBEC: DELAYED_RELEASE_TABLET | ORAL | 0 refills | Status: DC

## 2017-06-11 MED ORDER — LINACLOTIDE 72 MCG PO CAPS: 72.0000 ug | ORAL_CAPSULE | Freq: Every day | ORAL | 2 refills | Status: DC

## 2017-06-11 MED ORDER — MUPIROCIN 2 % EX OINT: 1.0000 "application " | TOPICAL_OINTMENT | Freq: Three times a day (TID) | CUTANEOUS | 0 refills | Status: DC

## 2017-06-11 NOTE — Progress Notes (Signed)
Subjective:  Amber Pope is a 31 y.o. female who presents as a new patient.  Moved here from [place] this past fall.  Is married, had a baby back in May, is breastfeeding.   She has several concerns.  She recently was seen at Urgent care for sinusitis, given antibiotic, but she continues to have several week hx/o recurrent sores in her nose. She denies hx/o similar prior, no hx/o herpes, no recent sick contacts.    She does get some nasal congestion and some blood tinged nasal drainage  She reports chornic constipation and chronic GERD, hx/o gastric ulcer on prior EGD.  She had normal colonoscopy back in 2016, and EGD with ulcers and GERD in 2016 as well.   Father has hx/o esophageal cancer, died when she was young  She has chronic regional pain syndrome in right lower extremity.  [facility] clinic in [facility].  Uses oxycodone prn, typically 1/2 tablet occasionally, not daily.  She notes in past few days having some right low back pain.  No worsening of the CRPS.  No fever, no blood in urine or stool.  No other aggravating or relieving factors.    No other c/o.  The following portions of the patient's history were reviewed and updated as appropriate: allergies, current medications, past family history, past medical history, past social history, past surgical history and problem list.  Past Medical History:  Diagnosis Date  . Anxiety   . Asthma   . Complication of anesthesia    tends to experience "excessive hysteria" post op  . CRPS (complex regional pain syndrome type I)   . Depression   . GERD (gastroesophageal reflux disease)   . Panic disorder   . Renal calculus    Past Surgical History:  Procedure Laterality Date  . ABLATION ON ENDOMETRIOSIS N/A 01/06/2013   Procedure: Robotic-Assisted Resection of Endometriosis;  Surgeon: Lovenia Kim, MD;  Location: [facility];  Service: Gynecology;  Laterality: N/A;  . kidney stent placement     with later removal  .  LITHOTRIPSY     x 2  . ROBOTIC ASSISTED LAPAROSCOPIC LYSIS OF ADHESION N/A 01/06/2013   Procedure: Robotic-Assisted Laparoscopic Lysis of Adhesions;  Surgeon: Lovenia Kim, MD;  Location: [facility];  Service: Gynecology;  Laterality: N/A;   Family History  Problem Relation Age of Onset  . Diabetes Paternal Grandfather   . Diabetes Maternal Grandfather   . Esophageal cancer Father   . GER disease Father   . Barrett's esophagus Father   . GER disease Mother   . Ulcers Brother     ROS Otherwise as in subjective above  Objective:Exam BP 122/80   Pulse 79   Resp 16   Wt 148 lb 12.8 oz (67.5 kg)   SpO2 99%   BMI 25.54 kg/m   General appearance: alert, no distress, WD/WN, white female HEENT: normocephalic, sclerae anicteric, conjunctiva pink and moist, TMs pearly, nares patent,mild mucoid discharge , + erythema, but no obvious ulcer or sore, pharynx normal Oral cavity: MMM, no lesions Neck: supple, no lymphadenopathy, no thyromegaly, no masses Heart: RRR, normal S1, S2, no murmurs Lungs: CTA bilaterally, no wheezes, rhonchi, or rales Abdomen: +bs, soft, non tender, non distended, no masses, no hepatomegaly, no splenomegaly Pulses: 2+ radial pulses, 2+ pedal pulses, normal cap refill Ext: no edema nontender lumbar spine, no back deformity Mild tenderness right calve medially and right ankle and foot throughout Legs neurovascularly intact    Assessment: Encounter Diagnoses  Name Primary?  . Nasal erythema Yes  . Gastroesophageal reflux disease, esophagitis presence not specified   . Chronic gastric ulcer without hemorrhage and without perforation   . Constipation, unspecified constipation type   . Lactating mother   . Family history of esophageal cancer   . Complex regional pain syndrome type 1, affecting unspecified site   . Right low back pain, unspecified chronicity, with sciatica presence unspecified   . Right foot pain     Plan: Discussed her health history,  concerns, medications ,and reviewed medications for lactation safety.  GERD - c/t zantac or protonix prn depending upon worse flare, and continue to avoid triggers  Nasal erythema - begin mupirocin ointment.  discussed possible causes of her symptoms.  Call report 2wk.  constipation - discussed hydration, fiber intake, and can restart low dose Linzess every 2-3 days instead of daily  compelx regional pain syndrome - managed by pain clinic in [place]  Right low back pain - likely related to changes in hips adjusting from recent pregnancy.   No obvious exam findings today suggesting deformity or more serious cause.   C/t Tylenol prn OTC or pain medication through pain clinic   Diagnoses and all orders for this visit:  Nasal erythema  Gastroesophageal reflux disease, esophagitis presence not specified  Chronic gastric ulcer without hemorrhage and without perforation  Constipation, unspecified constipation type  Lactating mother  Family history of esophageal cancer  Complex regional pain syndrome type 1, affecting unspecified site  Right low back pain, unspecified chronicity, with sciatica presence unspecified  Right foot pain  Other orders -     mupirocin ointment (BACTROBAN) 2 %; Place 1 application into the nose 3 (three) times daily. -     pantoprazole (PROTONIX) 40 MG tablet; 1 tablet daily po 45 min prior to breakfast -     linaclotide (LINZESS) 72 MCG capsule; Take 1 capsule (72 mcg total) by mouth daily before breakfast.  Follow up: 2 wk call back

## 2017-06-12 ENCOUNTER — Other Ambulatory Visit: Payer: Self-pay

## 2017-06-12 ENCOUNTER — Telehealth: Payer: Self-pay

## 2017-06-12 ENCOUNTER — Other Ambulatory Visit: Payer: BLUE CROSS/BLUE SHIELD

## 2017-06-12 ENCOUNTER — Other Ambulatory Visit: Payer: Self-pay | Admitting: Medical

## 2017-06-12 DIAGNOSIS — L539 Erythematous condition, unspecified: Secondary | ICD-10-CM

## 2017-06-12 MED ORDER — PANTOPRAZOLE SODIUM 40 MG PO TBEC: DELAYED_RELEASE_TABLET | ORAL | 0 refills | Status: DC

## 2017-06-12 NOTE — Telephone Encounter (Signed)
Pt wants to  Know if she can go ahead with test for MRSA, she really concern about this ?

## 2017-06-15 LAB — NASAL CULTURE (N/P)

## 2017-09-04 ENCOUNTER — Ambulatory Visit (INDEPENDENT_AMBULATORY_CARE_PROVIDER_SITE_OTHER): Payer: BLUE CROSS/BLUE SHIELD | Admitting: Medical

## 2017-09-04 ENCOUNTER — Encounter: Payer: Self-pay | Admitting: Medical

## 2017-09-04 VITALS — BP 132/82 | HR 66 | Ht 63.0 in | Wt 142.8 lb

## 2017-09-04 DIAGNOSIS — K219 Gastro-esophageal reflux disease without esophagitis: Secondary | ICD-10-CM | POA: Diagnosis not present

## 2017-09-04 DIAGNOSIS — Z8709 Personal history of other diseases of the respiratory system: Secondary | ICD-10-CM

## 2017-09-04 DIAGNOSIS — Z800 Family history of malignant neoplasm of digestive organs: Secondary | ICD-10-CM | POA: Diagnosis not present

## 2017-09-04 DIAGNOSIS — Z7189 Other specified counseling: Secondary | ICD-10-CM | POA: Diagnosis not present

## 2017-09-04 DIAGNOSIS — Z0000 Encounter for general adult medical examination without abnormal findings: Secondary | ICD-10-CM | POA: Diagnosis not present

## 2017-09-04 DIAGNOSIS — Z7185 Encounter for immunization safety counseling: Secondary | ICD-10-CM

## 2017-09-04 DIAGNOSIS — K5900 Constipation, unspecified: Secondary | ICD-10-CM

## 2017-09-04 DIAGNOSIS — M79671 Pain in right foot: Secondary | ICD-10-CM | POA: Diagnosis not present

## 2017-09-04 DIAGNOSIS — G9050 Complex regional pain syndrome I, unspecified: Secondary | ICD-10-CM | POA: Diagnosis not present

## 2017-09-04 DIAGNOSIS — K257 Chronic gastric ulcer without hemorrhage or perforation: Secondary | ICD-10-CM

## 2017-09-04 DIAGNOSIS — B359 Dermatophytosis, unspecified: Secondary | ICD-10-CM | POA: Diagnosis not present

## 2017-09-04 MED ORDER — ALBUTEROL SULFATE HFA 108 (90 BASE) MCG/ACT IN AERS: 2.0000 | INHALATION_SPRAY | Freq: Four times a day (QID) | RESPIRATORY_TRACT | 0 refills | Status: DC | PRN

## 2017-09-04 MED ORDER — LUBIPROSTONE 24 MCG PO CAPS: 24.0000 ug | ORAL_CAPSULE | Freq: Two times a day (BID) | ORAL | 0 refills | Status: DC

## 2017-09-04 NOTE — Progress Notes (Signed)
Subjective:   HPI  Amber Pope is a 31 y.o. female who presents for physical Chief Complaint  Patient presents with  . Annual Exam    physical ,pt will bring form, stomach upset not able to have bm, need a referral to gi, spot on her stomach x 1 month     Medical care team includes: Juell Radney, Camelia Eng, PA-C here for primary care Dentist Eye doctor Gyn, Dr. Ronita Hipps  Concerns: Lactating.   Her baby is 47mo and he has new diagnosis of anal stenosis, and has had problems for months.  Neither of them are getting much sleep.     Has hx/o GERD, takes Pepcid and Protonix prn.  Usually starts with Pepcid for mild flares. Has hx/o gastric ulcer, strong family history of GI issues, and father died of esophageal cancer.     Takes her prenatal vitamin intermittent as it does irritate stomach some.  Is still nursing, and plans to get pregnant again in the next year.  Her first child is 572moand he has had a lot of GI issues.  She initially wanted 5 kids, but has settled on the thought of having 3.  Has spot on abdomen she wants checked out. Skin lesion.    Lost 8lb intentional in recent months, maternity weight.  She thinks her Tdap was updated last year, but not sure.    Asthma - last flare up 3 years.  In past had nebulized.    constipation - linzess gave her loose stools.   Last BM 3 days ago   Used amitiza prior with better success than Linzess.    Reviewed their medical, surgical, family, social, medication, and allergy history and updated chart as appropriate.  Past Medical History:  Diagnosis Date  . Allergy   . Anxiety   . Asthma   . Complication of anesthesia    tends to experience "excessive hysteria" post op  . Constipation   . CRPS (complex regional pain syndrome type I)   . Depression   . Gastric ulcer   . GERD (gastroesophageal reflux disease)   . Panic disorder   . Renal calculus   . Wears glasses     Past Surgical History:  Procedure Laterality Date  .  ABLATION ON ENDOMETRIOSIS N/A 01/06/2013   Procedure: Robotic-Assisted Resection of Endometriosis;  Surgeon: RiLovenia KimMD;  [facility];  Service: Gynecology;  Laterality: N/A;  . kidney stent placement     with later removal  . LITHOTRIPSY     x 2  . ROBOTIC ASSISTED LAPAROSCOPIC LYSIS OF ADHESION N/A 01/06/2013   Procedure: Robotic-Assisted Laparoscopic Lysis of Adhesions;  Surgeon: RiLovenia KimMD;  [facility];  Service: Gynecology;  Laterality: N/A;  . TENDON RECONSTRUCTION  10/2015   right foot    Social History   Social History  . Marital status: Married    Spouse name: N/A  . Number of children: 0  . Years of education: N/A   Occupational History  .  [employer]   Social History Main Topics  . Smoking status: Former SmResearch scientist (life sciences). Smokeless tobacco: Never Used     Comment: prior social smoker, not prior regular smoker  . Alcohol use No  . Drug use: No     Comment: 2 times weekly   . Sexual activity: Not on file   Other Topics Concern  . Not on file   Social History Narrative   Lives with husband and son.  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   Diet - most home cooked, healthy.   08/2017    Family History  Problem Relation Age of Onset  . Diabetes Paternal Grandfather   . Diabetes Maternal Grandfather   . COPD Maternal Grandfather   . Dementia Maternal Grandfather   . Esophageal cancer Father   . GER disease Father   . Barrett's esophagus Father   . Cancer Father 51       esophageal  . Hypertension Father   . GER disease Mother   . Depression Mother   . Hyperlipidemia Mother   . Ulcers Brother   . Sleep apnea Brother   . COPD Maternal Grandmother   . Heart disease Neg Hx   . Stroke Neg Hx      Current Outpatient Prescriptions:  .  famotidine (PEPCID AC) 10 MG chewable tablet, Chew 1 tablet (10 mg total) by mouth as needed for heartburn (take with each dose MOTRIN)., Disp: 90 tablet, Rfl: 2 .  pantoprazole  (PROTONIX) 40 MG tablet, 1 tablet daily po 45 min prior to breakfast, Disp: 90 tablet, Rfl: 0 .  Prenatal Vit-Fe Fumarate-FA (PRENATAL MULTIVITAMIN) TABS tablet, Take 1 tablet by mouth daily at 12 noon., Disp: , Rfl:  .  albuterol (PROVENTIL HFA;VENTOLIN HFA) 108 (90 Base) MCG/ACT inhaler, Inhale 2 puffs into the lungs every 6 (six) hours as needed for wheezing or shortness of breath., Disp: 1 Inhaler, Rfl: 0 .  lubiprostone (AMITIZA) 24 MCG capsule, Take 1 capsule (24 mcg total) by mouth 2 (two) times daily with a meal., Disp: 60 capsule, Rfl: 0 .  magnesium oxide (MAG-OX) 400 (241.3 Mg) MG tablet, Take 1 tablet (400 mg total) by mouth daily. (Patient not taking: Reported on 09/04/2017), Disp: 90 tablet, Rfl: 4 .  oxyCODONE-acetaminophen (PERCOCET/ROXICET) 5-325 MG tablet, Take 1 tablet by mouth every 4 (four) hours as needed (pain scale 4-7). (Patient not taking: Reported on 09/04/2017), Disp: 30 tablet, Rfl: 0  Allergies  Allergen Reactions  . Lodine [Etodolac] Anaphylaxis and Rash    Has taken ibuprofen in past with no problems, per patient    Review of Systems Constitutional: -fever, -chills, -sweats, -unexpected weight change, -decreased appetite, -fatigue Allergy: -sneezing, -itching, -congestion Dermatology: -changing moles, +rash, -lumps ENT: -runny nose, -ear pain, -sore throat, -hoarseness, -sinus pain, -teeth pain, - ringing in ears, -hearing loss, -nosebleeds Cardiology: -chest pain, -palpitations, -swelling, -difficulty breathing when lying flat, -waking up short of breath Respiratory: -cough, -shortness of breath, -difficulty breathing with exercise or exertion, -wheezing, -coughing up blood Gastroenterology: +abdominal pain, -nausea, -vomiting, -diarrhea, +constipation, -blood in stool, -changes in bowel movement, -difficulty swallowing or eating Hematology: -bleeding, -bruising  Musculoskeletal: -joint aches, -muscle aches, -joint swelling, -back pain, -neck pain, -cramping,  -changes in gait Ophthalmology: denies vision changes, eye redness, itching, discharge Urology: -burning with urination, -difficulty urinating, -blood in urine, -urinary frequency, -urgency, -incontinence Neurology: -headache, -weakness, -tingling, -numbness, -memory loss, -falls, -dizziness Psychology: -depressed mood, -agitation, -sleep problems Breast/gyn: -breast tenderness, -discharge, -lumps, -vaginal discharge,- irregular periods, -heavy periods    Objective:   BP 132/82   Pulse 66   Ht _0  (1.6 m)   Wt 142 lb 12.8 oz (64.8 kg)   SpO2 99%   BMI 25.30 kg/m   General appearance: alert, no distress, WD/WN, Caucasian female Skin: 2cm round pink red rash of right lower abdomen, with somewhat clearing center suggestive of ringworm, otherwise no worrisome lesions HEENT: normocephalic, conjunctiva/corneas normal, sclerae  anicteric, PERRLA, EOMi, nares patent, no discharge or erythema, pharynx normal Oral cavity: MMM, tongue normal, teeth normal Neck: supple, no lymphadenopathy, no thyromegaly, no masses, normal ROM, no bruits Chest: non tender, normal shape and expansion Heart: RRR, normal S1, S2, no murmurs Lungs: CTA bilaterally, no wheezes, rhonchi, or rales Abdomen: +bs, soft, non tender, non distended, no masses, no hepatomegaly, no splenomegaly, no bruits Back: non tender, normal ROM, no scoliosis Musculoskeletal: tender right foot in general.   Otherwise upper extremities non tender, no obvious deformity, normal ROM throughout, lower extremities non tender, no obvious deformity, normal ROM throughout Extremities: no edema, no cyanosis, no clubbing Pulses: 2+ symmetric, upper and lower extremities, normal cap refill Neurological: alert, oriented x 3, CN2-12 intact, strength normal upper extremities and lower extremities, sensation normal throughout, DTRs 2+ throughout, no cerebellar signs, gait normal Psychiatric: normal affect, behavior normal, pleasant  Breast/gyn/rectal -  deferred to gynecology    Assessment and Plan :    Encounter Diagnoses  Name Primary?  . Encounter for health maintenance examination in adult Yes  . Chronic gastric ulcer without hemorrhage and without perforation   . Gastroesophageal reflux disease, esophagitis presence not specified   . Constipation, unspecified constipation type   . Complex regional pain syndrome type 1, affecting unspecified site   . Family history of esophageal cancer   . Lactating mother   . Right foot pain   . Vaccine counseling   . Ringworm   . History of asthma     Physical exam - discussed and counseled on healthy lifestyle, diet, exercise, preventative care, vaccinations, sick and well care, proper use of emergency dept and after hours care, and addressed their concerns.    Health screening: See your eye doctor yearly for routine vision care. See your dentist yearly for routine dental care including hygiene visits twice yearly.  Cancer screening Discussed and advised monthly self breast exams Discussed pap smear recommendations.   Pap smear up to date per her report this past year  Vaccinations: She is getting flu shot soon with her son She will check Tdap status with gyn  Separate significant chronic issues discussed: Referral to GI for chronic GI issues, including GERD, ulcer, constipation, family hx/o GI issues.  Refilled Amitiza.  CRPS - f/u with pain clinic  Ringworm - begin Lamisil cream x 2- 3  Weeks until resolved  Asthma - refilled inhaler for prn use.     [place] was seen today for annual exam.  Diagnoses and all orders for this visit:  Encounter for health maintenance examination in adult -     Comprehensive metabolic panel -     CBC with Differential/Platelet -     Lipid panel -     TSH -     VITAMIN D 25 Hydroxy (Vit-D Deficiency, Fractures)  Chronic gastric ulcer without hemorrhage and without perforation -     lubiprostone (AMITIZA) 24 MCG capsule; Take 1 capsule (24  mcg total) by mouth 2 (two) times daily with a meal. -     Ambulatory referral to Gastroenterology  Gastroesophageal reflux disease, esophagitis presence not specified -     lubiprostone (AMITIZA) 24 MCG capsule; Take 1 capsule (24 mcg total) by mouth 2 (two) times daily with a meal. -     Ambulatory referral to Gastroenterology  Constipation, unspecified constipation type -     lubiprostone (AMITIZA) 24 MCG capsule; Take 1 capsule (24 mcg total) by mouth 2 (two) times daily with a meal. -  Ambulatory referral to Gastroenterology  Complex regional pain syndrome type 1, affecting unspecified site  Family history of esophageal cancer  Lactating mother  Right foot pain  Vaccine counseling  Ringworm  History of asthma -     albuterol (PROVENTIL HFA;VENTOLIN HFA) 108 (90 Base) MCG/ACT inhaler; Inhale 2 puffs into the lungs every 6 (six) hours as needed for wheezing or shortness of breath.    Follow-up pending labs, yearly for physical

## 2017-09-04 NOTE — Patient Instructions (Signed)
Recommendations  Get OTC Lamisil cream to use on the rash on the abdomen  It may take 2-3 weeks for this to resolve  If not improving at all in the coming weeks, call back  constipation  Try to get at least 25 g fiber daily in the diet  Try and drink at least 64 oz water daily  consider a daily fiber supplement such as fibercon   Get your flu shot soon  Check with Dr. Kennith Maes office about Tdap vaccine.    See your eye doctor yearly for routine vision care.  See your dentist yearly for routine dental care including hygiene visits twice yearly.

## 2017-09-05 LAB — CBC WITH DIFFERENTIAL/PLATELET
BASOS PCT: 0.8 %
Basophils Absolute: 47 cells/uL (ref 0–200)
EOS PCT: 1.4 %
Eosinophils Absolute: 83 cells/uL (ref 15–500)
HCT: 41 % (ref 35.0–45.0)
Hemoglobin: 14.1 g/dL (ref 11.7–15.5)
Lymphs Abs: 2342 cells/uL (ref 850–3900)
MCH: 28 pg (ref 27.0–33.0)
MCHC: 34.4 g/dL (ref 32.0–36.0)
MCV: 81.3 fL (ref 80.0–100.0)
MONOS PCT: 5.4 %
MPV: 9.5 fL (ref 7.5–12.5)
NEUTROS PCT: 52.7 %
Neutro Abs: 3109 cells/uL (ref 1500–7800)
PLATELETS: 281 10*3/uL (ref 140–400)
RBC: 5.04 10*6/uL (ref 3.80–5.10)
RDW: 12.5 % (ref 11.0–15.0)
TOTAL LYMPHOCYTE: 39.7 %
WBC: 5.9 10*3/uL (ref 3.8–10.8)
WBCMIX: 319 {cells}/uL (ref 200–950)

## 2017-09-05 LAB — LIPID PANEL
Cholesterol: 151 mg/dL (ref <200)
HDL: 56 mg/dL (ref >50)
LDL Cholesterol (Calc): 84 mg/dL (calc)
NON-HDL CHOLESTEROL (CALC): 95 mg/dL (ref <130)
TRIGLYCERIDES: 38 mg/dL (ref <150)
Total CHOL/HDL Ratio: 2.7 (calc) (ref <5.0)

## 2017-09-05 LAB — COMPREHENSIVE METABOLIC PANEL
AG RATIO: 1.6 (calc) (ref 1.0–2.5)
ALT: 6 U/L (ref 6–29)
AST: 13 U/L (ref 10–30)
Albumin: 4.5 g/dL (ref 3.6–5.1)
Alkaline phosphatase (APISO): 103 U/L (ref 33–115)
BUN: 14 mg/dL (ref 7–25)
CALCIUM: 9.6 mg/dL (ref 8.6–10.2)
CO2: 21 mmol/L (ref 20–32)
Chloride: 105 mmol/L (ref 98–110)
Creat: 0.84 mg/dL (ref 0.50–1.10)
GLUCOSE: 77 mg/dL (ref 65–99)
Globulin: 2.9 g/dL (calc) (ref 1.9–3.7)
Potassium: 4.4 mmol/L (ref 3.5–5.3)
Sodium: 139 mmol/L (ref 135–146)
Total Bilirubin: 0.6 mg/dL (ref 0.2–1.2)
Total Protein: 7.4 g/dL (ref 6.1–8.1)

## 2017-09-05 LAB — VITAMIN D 25 HYDROXY (VIT D DEFICIENCY, FRACTURES): Vit D, 25-Hydroxy: 16 ng/mL — ABNORMAL LOW (ref 30–100)

## 2017-09-05 LAB — TSH: TSH: 1.9 m[IU]/L

## 2017-09-06 ENCOUNTER — Other Ambulatory Visit: Payer: Self-pay | Admitting: Medical

## 2017-09-06 MED ORDER — VITAMIN D (ERGOCALCIFEROL) 1.25 MG (50000 UNIT) PO CAPS: 50000.0000 [IU] | ORAL_CAPSULE | ORAL | 0 refills | Status: DC

## 2017-09-07 ENCOUNTER — Other Ambulatory Visit: Payer: Self-pay | Admitting: Medical

## 2017-09-08 ENCOUNTER — Telehealth: Payer: Self-pay | Admitting: Medical

## 2017-09-08 NOTE — Telephone Encounter (Signed)
  Fax from [facility] not covered by patient plan, preferred alternative is  Levalbuterolaeract,  pro air hfa aer , proair hfa aer, pro air respiaer Copy of request in folder also

## 2017-09-09 ENCOUNTER — Other Ambulatory Visit: Payer: Self-pay | Admitting: Medical

## 2017-09-09 MED ORDER — ALBUTEROL SULFATE 108 (90 BASE) MCG/ACT IN AEPB: 1.0000 | INHALATION_SPRAY | Freq: Four times a day (QID) | RESPIRATORY_TRACT | 0 refills | Status: DC | PRN

## 2017-09-09 NOTE — Telephone Encounter (Signed)
done

## 2017-09-11 ENCOUNTER — Telehealth: Payer: Self-pay | Admitting: Medical

## 2017-09-11 NOTE — Telephone Encounter (Signed)
Pt dropped off Physician Results Form from [facility] to be completed and faxed to [facility]. Pt requested that she be called when form is faxed.

## 2017-09-11 NOTE — Telephone Encounter (Signed)
done

## 2017-09-11 NOTE — Telephone Encounter (Signed)
Called pt and advised that form faxed today

## 2017-10-06 ENCOUNTER — Encounter: Payer: Self-pay | Admitting: Physician Assistant

## 2017-10-07 ENCOUNTER — Ambulatory Visit (INDEPENDENT_AMBULATORY_CARE_PROVIDER_SITE_OTHER): Payer: BLUE CROSS/BLUE SHIELD | Admitting: Medical

## 2017-10-07 VITALS — BP 110/68 | HR 73 | Temp 98.3°F | Wt 145.2 lb

## 2017-10-07 DIAGNOSIS — J029 Acute pharyngitis, unspecified: Secondary | ICD-10-CM | POA: Diagnosis not present

## 2017-10-07 DIAGNOSIS — J0100 Acute maxillary sinusitis, unspecified: Secondary | ICD-10-CM

## 2017-10-07 LAB — POCT RAPID STREP A (OFFICE): Rapid Strep A Screen: NEGATIVE

## 2017-10-07 MED ORDER — AMOXICILLIN 500 MG PO TABS: ORAL_TABLET | ORAL | 0 refills | Status: DC

## 2017-10-07 NOTE — Progress Notes (Signed)
Subjective:  Amber Pope is a 31 y.o. female who presents for possible sinus infection.   Symptoms include dyspnea, headache, nasal congestion, productive cough and sore throat. Denies ear pain, fever and sweats. Using albuterol for symptoms. denies sick contacts.  She is taking care of a toddler who has significant stomach issues so she gets very little sleep in general.  She is nursing. Past history is significant for chronic sinus problems, in the past requiring prednisone and antibiotics.   Patient is not a smoker. No other aggravating or relieving factors.  No other c/o.  Past Medical History:  Diagnosis Date  . Allergy   . Anxiety   . Asthma   . Complication of anesthesia    tends to experience "excessive hysteria" post op  . Constipation   . CRPS (complex regional pain syndrome type I)    [place], Dr. Fredric Mare  . Depression   . Gastric ulcer   . GERD (gastroesophageal reflux disease)   . Panic disorder   . Renal calculus   . Wears glasses     ROS as in subjective   Objective: BP 110/68   Pulse 73   Temp 98.3 F (36.8 C)   Wt 145 lb 3.2 oz (65.9 kg)   SpO2 98%   BMI 25.72 kg/m   General appearance: Alert, WD/WN, no distress                             Skin: warm, no rash                           Head: + mild maxillary sinus tenderness,                            Eyes: conjunctiva normal, corneas clear, PERRLA                            Ears: pearly TMs, external ear canals normal                          Nose: septum midline, turbinates swollen, with erythema and mucoid discharge             Mouth/throat: MMM, tongue normal, mild pharyngeal erythema                           Neck: supple, shoddy tender anterior nodes, no thyromegaly, non tender                         Lungs: CTA bilaterally, no wheezes, rales, or rhonchi       Assessment  Encounter Diagnoses  Name Primary?  . Acute maxillary sinusitis, recurrence not specified Yes   . Sore throat   . Breast feeding status of mother       Plan: Discussed symptoms, findings, and treatment recommendations including rest, hydration, Tylenol prn, nasal saline flush, begin antibiotic below.    Amber Pope was seen today for sore throat.  Diagnoses and all orders for this visit:  Acute maxillary sinusitis, recurrence not specified  Sore throat -     Rapid Strep A  Breast feeding status of mother  Other orders -     amoxicillin (AMOXIL) 500  MG tablet; 2 tablets po BID x 10 days   Patient was advised to call or return if worse or not improving in the next few days.    Patient voiced understanding of diagnosis, recommendations, and treatment plan.

## 2017-10-14 NOTE — Addendum Note (Signed)
Addended by: Tiana Loft on: 10/14/2017 01:20 PM   Modules accepted: Miquel Dunn

## 2017-10-15 ENCOUNTER — Ambulatory Visit: Payer: BLUE CROSS/BLUE SHIELD | Admitting: Physician Assistant

## 2017-10-28 ENCOUNTER — Other Ambulatory Visit (INDEPENDENT_AMBULATORY_CARE_PROVIDER_SITE_OTHER): Payer: BLUE CROSS/BLUE SHIELD

## 2017-10-28 ENCOUNTER — Ambulatory Visit (INDEPENDENT_AMBULATORY_CARE_PROVIDER_SITE_OTHER): Payer: BLUE CROSS/BLUE SHIELD | Admitting: Physician Assistant

## 2017-10-28 ENCOUNTER — Encounter: Payer: Self-pay | Admitting: Physician Assistant

## 2017-10-28 VITALS — BP 100/70 | HR 84 | Ht 63.19 in | Wt 143.5 lb

## 2017-10-28 DIAGNOSIS — K219 Gastro-esophageal reflux disease without esophagitis: Secondary | ICD-10-CM | POA: Diagnosis not present

## 2017-10-28 DIAGNOSIS — K5909 Other constipation: Secondary | ICD-10-CM

## 2017-10-28 LAB — TSH: TSH: 2.56 u[IU]/mL (ref 0.35–4.50)

## 2017-10-28 NOTE — Progress Notes (Signed)
Subjective:    Patient ID: Amber Pope, female    DOB: [birth year], 31 y.o.   MRN: [id number]  HPI Amber Pope is a very nice 31 year old white female, new to GI today self-referred for evaluation of chronic constipation and history of GERD.  She is known remotely to Dr. Delfin Edis and had undergone upper endoscopy in 2012 which was a normal exam. Patient says she was living in [place] for a few years and about 2 years ago had seen a gastroenterologist there.  At that time she was having a terrible time with CR PS, and was actually in a wheelchair for a while.  She had undergone an EGD and colonoscopy both of which she said were normal.  She was given trials of Amitiza in the past and also Linzess for her chronic constipation but neither seem to work.  Since that time she had been pregnant and used fleets enemas most days while pregnant to produce a bowel movement.  She now has a 18-monthold baby, is nursing and has continued using fleets enemas on a regular basis.  She says that she does not use an enema she will not have a bowel movement.  She gets uncomfortable with within 1-2 days without a bowel movement and feels bloated.  She also relates having intermittent left lower quadrant discomfort which is been present for over a year.  No melena or hematochezia.  She says remarkably pregnancy seem to put her CRPS in remission and she has not had any problems since. She also has history of chronic reflux.  [facility] has [facility] and has [facility].  She says she is having more problems with reflux and is taking more than the recommended dose of Pepcid AC.  She does have a prescription for Protonix which she plans to refill.  This has worked well in the past.  She plans to keep nursing for at least another 6 months.  Review of Systems Pertinent positive and negative review of systems were noted in the above HPI section.  All other review of systems was  otherwise negative.  Outpatient Encounter Medications as of 10/28/2017  Medication Sig  . Albuterol Sulfate (PROAIR RESPICLICK) 1253(90 Base) MCG/ACT AEPB Inhale 1 Inhaler into the lungs every 6 (six) hours as needed.  .Marland Kitchenamoxicillin (AMOXIL) 500 MG tablet 2 tablets po BID x 10 days  . famotidine (PEPCID AC) 10 MG chewable tablet Chew 1 tablet (10 mg total) by mouth as needed for heartburn (take with each dose MOTRIN).  . Vitamin D, Ergocalciferol, (DRISDOL) 50000 units CAPS capsule Take 1 capsule (50,000 Units total) by mouth every 7 (seven) days.  . [DISCONTINUED] lubiprostone (AMITIZA) 24 MCG capsule Take 1 capsule (24 mcg total) by mouth 2 (two) times daily with a meal. (Patient not taking: Reported on 10/07/2017)  . [DISCONTINUED] Prenatal Vit-Fe Fumarate-FA (PRENATAL MULTIVITAMIN) TABS tablet Take 1 tablet by mouth daily at 12 noon.   No facility-administered encounter medications on file as of 10/28/2017.    Allergies  Allergen Reactions  . Lodine [Etodolac] Anaphylaxis and Rash    Has taken ibuprofen in past with no problems, per patient   Patient Active Problem List   Diagnosis Date Noted  . Encounter for health maintenance examination in adult 09/04/2017  . Vaccine counseling 09/04/2017  . Ringworm 09/04/2017  . History of asthma 09/04/2017  . Nasal erythema 06/11/2017  . Constipation 06/11/2017  . Chronic gastric  ulcer without hemorrhage and without perforation 06/11/2017  . Gastroesophageal reflux disease 06/11/2017  . Lactating mother 06/11/2017  . Family history of esophageal cancer 06/11/2017  . Complex regional pain syndrome type 1 06/11/2017  . Right low back pain 06/11/2017  . Right foot pain 06/11/2017  . Normal labor 03/30/2017  . SVD (spontaneous vaginal delivery) 03/30/2017  . Postpartum care following vaginal delivery (5/21) 03/30/2017   Social History   Socioeconomic History  . Marital status: Married    Spouse name: Not on file  . Number of children:  1  . Years of education: Not on file  . Highest education level: Not on file  Social Needs  . Financial resource strain: Not on file  . Food insecurity - worry: Not on file  . Food insecurity - inability: Not on file  . Transportation needs - medical: Not on file  . Transportation needs - non-medical: Not on file  Occupational History    Employer: [employer]  Tobacco Use  . Smoking status: Former Smoker    Last attempt to quit: 2017    Years since quitting: 1.9  . Smokeless tobacco: Never Used  . Tobacco comment: prior social smoker, not prior regular smoker  Substance and Sexual Activity  . Alcohol use: No  . Drug use: No    Comment: 2 times weekly   . Sexual activity: Not on file  Other Topics Concern  . 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   Diet - most home cooked, healthy.   08/2017    Amber Pope's family history includes Barrett's esophagus in her father; COPD in her maternal grandfather and maternal grandmother; Dementia in her maternal grandmother; Depression in her mother; Diabetes in her maternal grandfather and paternal grandfather; Esophageal cancer in her father; GER disease in her brother, father, mother, and son; Hyperlipidemia in her mother; Hypertension in her father; Other in her son; Sleep apnea in her brother; Ulcers in her brother.      Objective:    Vitals:   10/28/17 1324  BP: 100/70  Pulse: 84    Physical Exam well-developed young white female in no acute distress, very pleasant blood pressure 100/70 pulse 84, height 5 foot 3, weight 143, BMI of 25.2.  HEENT; nontraumatic normocephalic EOMI PERRLA sclera anicteric, Cardiovascular; regular rate and rhythm with S1-S2 no murmur rub or gallop, Pulmonary ;clear bilaterally, Abdomen; soft, nondistended bowel sounds are active there is no palpable mass or hepatosplenomegaly, Rectal; exam not done, Extremities ;no  clubbing cyanosis or edema skin warm and dry, Neuro psych ;mood and affect appropriate       Assessment & Plan:   #18 31 year old white female with long-standing chronic constipation which initially worsened after she was diagnosed with CRPS and was requiring pain medications.  CRPS has been in remission since she became pregnant and she has not had any issues over the past 18 months.  She is currently using fleets enemas on a daily basis. Previous nonresponse to Amitiza and Linzess  #2 history of chronic GERD-currently using Pepcid AC which is not working well. #3 family history of esophageal CA in patient's father  Plan; reviewed strict antireflux regimen Patient has a prescription for Protonix 40 mg p.o. every morning which she plans to refill.  She would prefer not to stay on a PPI long-term.  I suggested she try Protonix for 2 months and  then once symptoms are under good control perhaps she can scale back to Pepcid [facility] Start MiraLAX 17 g in 8 ounces of water every day.  Increase fluid intake to at least 64 ounces per day.  We discussed benefit of daily exercise, consider adding dried prunes or prune juice daily. I think she also needs some bowel retraining and is advised to set aside some time each day for bowel habits and also consider use of a squatty potty as she may have some pelvic dyssynergy by her description of symptoms. Will check TSH Patient will follow up on an as-needed basis with me, and will be established with Dr. Gareth Morgan PA-C 10/28/2017   Cc: Carlena Hurl, PA-C

## 2017-10-28 NOTE — Patient Instructions (Signed)
Please go to the basement level to have your labs drawn.   Take Miralax 17 grams in 8 oz of water every day. Try Prunes or prune juice daily. Drink 64 ounces of water daily.  Make time for bathroom habits each morning. Consider "Squatty Potty".   We have provided you with antireflux information.

## 2017-10-28 NOTE — Progress Notes (Signed)
Reviewed and agree with initial management plan.  Aryianna Earwood T. Daquan Crapps, MD FACG 

## 2017-10-29 ENCOUNTER — Encounter: Payer: Self-pay | Admitting: Family Medicine

## 2017-10-29 ENCOUNTER — Ambulatory Visit (INDEPENDENT_AMBULATORY_CARE_PROVIDER_SITE_OTHER): Payer: BLUE CROSS/BLUE SHIELD | Admitting: Family Medicine

## 2017-10-29 VITALS — BP 120/70 | HR 72 | Temp 98.5°F | Ht 64.0 in | Wt 144.2 lb

## 2017-10-29 DIAGNOSIS — J019 Acute sinusitis, unspecified: Secondary | ICD-10-CM

## 2017-10-29 MED ORDER — AMOXICILLIN-POT CLAVULANATE 875-125 MG PO TABS: 1.0000 | ORAL_TABLET | Freq: Two times a day (BID) | ORAL | 0 refills | Status: DC

## 2017-10-29 NOTE — Patient Instructions (Signed)
Stop the amoxicillin.  Start taking Augmentin.  I sent this to your pharmacy.  Use Flonase 2 sprays once daily or 1 spray twice daily. You can use the neti.

## 2017-10-29 NOTE — Progress Notes (Signed)
Chief Complaint  Patient presents with  . Sinusitis    saw Audelia Acton 10/07/17 for sinusitis. Started the amoxil 8 days ago but is actually getting worse.     Subjective:  Amber Pope is a 31 y.o. female who presents for possible sinus infection.  She was seen on 10/07/2017 for acute sinusitis diagnosed by Dorothea Ogle her PCP.  She was prescribed antibiotics and states she is currently on day 8 of amoxicillin.  States she is actually much worse than when she started the antibiotic.  Symptoms include a 2 week history of nasal congestion, ear pain, sinus pain, sore throat, and now has a mild cough.  History of asthma but has not needed her rescue inhaler.  No acute flare. Denies fever, chills, chest pain, palpitations, shortness of breath, abdominal pain, N/V/D.   She has a 64 month old and is breast feeding.   Past history is significant for asthma and bronchitis. Patient is a former smoker, quit over ayear ago.  Using Amoxil for symptoms.  Denies sick contacts.  No other aggravating or relieving factors.  No other c/o.   Reports history of recurrent sinusitis.   ROS as in subjective    Objective: Vitals:   10/29/17 1450  BP: 120/70  Pulse: 72  Temp: 98.5 F (36.9 C)    General appearance: Alert, WD/WN, no distress                             Skin: warm, no rash                           Head: + maxillary sinus tenderness,                            Eyes: conjunctiva normal, corneas clear, PERRLA                            Ears: pearly TMs, external ear canals normal                          Nose: septum midline, turbinates swollen, with erythema and clear discharge             Mouth/throat: MMM, tongue normal, mild pharyngeal erythema                           Neck: supple, no adenopathy, no thyromegaly, nontender                          Heart: RRR, normal S1, S2, no murmurs                         Lungs: CTA bilaterally, no wheezes, rales, or rhonchi       Assessment and  Plan: Acute sinusitis with symptoms > 10 days - Plan: amoxicillin-clavulanate (AUGMENTIN) 875-125 MG tablet  Breast feeding status of mother   She reports feeling much worse since starting amoxicillin.  We will discontinue this and switch her to Augmentin.  She is requesting oral steroids however she has not tried topical Flonase yet.  Recommend that she try Flonase since she is breast-feeding. Discussed symptomatic relief, nasal saline flush, and call or return if  worse or not back to baseline after completing the antibiotics.

## 2017-10-30 ENCOUNTER — Other Ambulatory Visit: Payer: Self-pay | Admitting: Medical

## 2017-10-30 ENCOUNTER — Telehealth: Payer: Self-pay | Admitting: Medical

## 2017-10-30 MED ORDER — PREDNISONE 20 MG PO TABS: ORAL_TABLET | ORAL | 0 refills | Status: DC

## 2017-10-30 NOTE — Telephone Encounter (Signed)
Called and left message for her to call back.

## 2017-10-30 NOTE — Telephone Encounter (Signed)
I spoke to patient.   Called out short round of prednisone, discussed proper use, precaution with breastfeeding.

## 2017-10-30 NOTE — Telephone Encounter (Signed)
   Please call patient  She is concerned about her sinus/cough issues and feels she that she is n ot going to get any better and said she had discussed this with you before and wants to talk to you about getting rx for prednisone  She is going out of town for Christmas

## 2017-11-25 ENCOUNTER — Ambulatory Visit: Payer: BLUE CROSS/BLUE SHIELD | Admitting: Physician Assistant

## 2017-11-25 DIAGNOSIS — M5417 Radiculopathy, lumbosacral region: Secondary | ICD-10-CM | POA: Diagnosis not present

## 2017-11-25 DIAGNOSIS — M5441 Lumbago with sciatica, right side: Secondary | ICD-10-CM | POA: Diagnosis not present

## 2017-11-25 DIAGNOSIS — G5771 Causalgia of right lower limb: Secondary | ICD-10-CM | POA: Diagnosis not present

## 2017-11-25 DIAGNOSIS — G894 Chronic pain syndrome: Secondary | ICD-10-CM | POA: Diagnosis not present

## 2017-12-09 DIAGNOSIS — R200 Anesthesia of skin: Secondary | ICD-10-CM | POA: Diagnosis not present

## 2017-12-09 DIAGNOSIS — M545 Low back pain: Secondary | ICD-10-CM | POA: Diagnosis not present

## 2017-12-21 DIAGNOSIS — R1032 Left lower quadrant pain: Secondary | ICD-10-CM | POA: Diagnosis not present

## 2018-03-24 ENCOUNTER — Other Ambulatory Visit: Payer: Self-pay | Admitting: Medical

## 2018-03-24 MED ORDER — VITAMIN D 1000 UNITS PO TABS: 2000.0000 [IU] | ORAL_TABLET | Freq: Every day | ORAL | 3 refills | Status: DC

## 2018-03-24 NOTE — Telephone Encounter (Signed)
Have her change to Vitamin D 1000 units daily unless her fingernails feel brittle or weak.   If fingernails feel brittle then use 2 tablets daily.

## 2018-03-25 NOTE — Telephone Encounter (Signed)
Pt was asked to call the office to change vitamin d to 1000 units per provider.

## 2018-05-20 DIAGNOSIS — F410 Panic disorder [episodic paroxysmal anxiety] without agoraphobia: Secondary | ICD-10-CM | POA: Diagnosis not present

## 2018-07-13 ENCOUNTER — Encounter: Payer: Self-pay | Admitting: Family Medicine

## 2018-07-13 ENCOUNTER — Ambulatory Visit (INDEPENDENT_AMBULATORY_CARE_PROVIDER_SITE_OTHER): Payer: BLUE CROSS/BLUE SHIELD | Admitting: Family Medicine

## 2018-07-13 VITALS — BP 106/72 | HR 87 | Temp 98.3°F | Wt 147.4 lb

## 2018-07-13 DIAGNOSIS — M25512 Pain in left shoulder: Secondary | ICD-10-CM

## 2018-07-13 DIAGNOSIS — K257 Chronic gastric ulcer without hemorrhage or perforation: Secondary | ICD-10-CM | POA: Diagnosis not present

## 2018-07-13 DIAGNOSIS — Z23 Encounter for immunization: Secondary | ICD-10-CM | POA: Diagnosis not present

## 2018-07-13 DIAGNOSIS — G9050 Complex regional pain syndrome I, unspecified: Secondary | ICD-10-CM | POA: Diagnosis not present

## 2018-07-13 DIAGNOSIS — K219 Gastro-esophageal reflux disease without esophagitis: Secondary | ICD-10-CM

## 2018-07-13 NOTE — Patient Instructions (Signed)
Heat for 20 minutes 3 times per day.  2 Aleve twice per day

## 2018-07-13 NOTE — Progress Notes (Signed)
   Subjective:    Patient ID: Amber Pope, female    DOB: 1986-07-12, 32 y.o.   MRN: [id number]  HPI Several days ago while getting out of bed, she pushed off with her left shoulder and felt a popping sensation.  Since then it has diminished slightly.  She states the pain is in the posterior part of the left shoulder.  She has a previous history of complex regional pain syndrome that did go away when she was pregnant she is now starting to have some more difficulty with that.  She is concerned that this might complicate the issue.  She also has a previous history of reflux and gastric ulcer.  She has not been on Protonix recently.   Review of Systems     Objective:   Physical Exam Alert and in no distress.  Full motion of the shoulder without pain.  No tenderness palpation over the bicipital groove.  Negative sulcus sign.  Neer's and Hawkins tests are negative.  Negative drop arm test.       Assessment & Plan:  Acute pain of left shoulder  Need for influenza vaccination - Plan: Flu Vaccine QUAD 6+ mos PF IM (Fluarix Quad PF)  Complex regional pain syndrome type 1, affecting unspecified site  Chronic gastric ulcer without hemorrhage and without perforation  Gastroesophageal reflux disease, esophagitis presence not specified I will treat her conservatively initially with an NSAID and heat.  She will start back on Protonix.  Cautioned against worsening of abdominal trouble including seeing black tarry stools.  She is aware of this.  If continued difficulty I discussed ultrasound versus injection.  We will probably go with an injection first.  She was comfortable with that.

## 2018-07-22 DIAGNOSIS — Z3202 Encounter for pregnancy test, result negative: Secondary | ICD-10-CM | POA: Diagnosis not present

## 2018-07-22 DIAGNOSIS — M791 Myalgia, unspecified site: Secondary | ICD-10-CM | POA: Diagnosis not present

## 2018-07-22 DIAGNOSIS — M5417 Radiculopathy, lumbosacral region: Secondary | ICD-10-CM | POA: Diagnosis not present

## 2018-07-22 DIAGNOSIS — G894 Chronic pain syndrome: Secondary | ICD-10-CM | POA: Diagnosis not present

## 2018-07-22 DIAGNOSIS — G5771 Causalgia of right lower limb: Secondary | ICD-10-CM | POA: Diagnosis not present

## 2018-07-22 DIAGNOSIS — R1031 Right lower quadrant pain: Secondary | ICD-10-CM | POA: Diagnosis not present

## 2018-07-22 DIAGNOSIS — N83201 Unspecified ovarian cyst, right side: Secondary | ICD-10-CM | POA: Diagnosis not present

## 2018-07-22 DIAGNOSIS — Z789 Other specified health status: Secondary | ICD-10-CM | POA: Diagnosis not present

## 2018-09-01 DIAGNOSIS — Z01419 Encounter for gynecological examination (general) (routine) without abnormal findings: Secondary | ICD-10-CM | POA: Diagnosis not present

## 2018-09-01 DIAGNOSIS — Z6826 Body mass index (BMI) 26.0-26.9, adult: Secondary | ICD-10-CM | POA: Diagnosis not present

## 2018-09-07 DIAGNOSIS — N83201 Unspecified ovarian cyst, right side: Secondary | ICD-10-CM | POA: Diagnosis not present

## 2018-09-20 ENCOUNTER — Ambulatory Visit (INDEPENDENT_AMBULATORY_CARE_PROVIDER_SITE_OTHER): Payer: BLUE CROSS/BLUE SHIELD | Admitting: Medical

## 2018-09-20 ENCOUNTER — Encounter: Payer: Self-pay | Admitting: Medical

## 2018-09-20 VITALS — BP 110/70 | HR 82 | Temp 98.6°F | Ht 64.0 in | Wt 147.6 lb

## 2018-09-20 DIAGNOSIS — E559 Vitamin D deficiency, unspecified: Secondary | ICD-10-CM | POA: Diagnosis not present

## 2018-09-20 DIAGNOSIS — G9050 Complex regional pain syndrome I, unspecified: Secondary | ICD-10-CM | POA: Diagnosis not present

## 2018-09-20 DIAGNOSIS — Z13220 Encounter for screening for lipoid disorders: Secondary | ICD-10-CM | POA: Diagnosis not present

## 2018-09-20 DIAGNOSIS — Z0000 Encounter for general adult medical examination without abnormal findings: Secondary | ICD-10-CM

## 2018-09-20 DIAGNOSIS — Z8709 Personal history of other diseases of the respiratory system: Secondary | ICD-10-CM

## 2018-09-20 DIAGNOSIS — Z7185 Encounter for immunization safety counseling: Secondary | ICD-10-CM

## 2018-09-20 DIAGNOSIS — Z7189 Other specified counseling: Secondary | ICD-10-CM

## 2018-09-20 DIAGNOSIS — K219 Gastro-esophageal reflux disease without esophagitis: Secondary | ICD-10-CM

## 2018-09-20 DIAGNOSIS — K5900 Constipation, unspecified: Secondary | ICD-10-CM

## 2018-09-20 DIAGNOSIS — K257 Chronic gastric ulcer without hemorrhage or perforation: Secondary | ICD-10-CM

## 2018-09-20 DIAGNOSIS — M778 Other enthesopathies, not elsewhere classified: Secondary | ICD-10-CM | POA: Insufficient documentation

## 2018-09-20 DIAGNOSIS — Z800 Family history of malignant neoplasm of digestive organs: Secondary | ICD-10-CM

## 2018-09-20 LAB — POCT URINALYSIS DIP (PROADVANTAGE DEVICE)
BILIRUBIN UA: NEGATIVE mg/dL
Bilirubin, UA: NEGATIVE
Glucose, UA: NEGATIVE mg/dL
Leukocytes, UA: NEGATIVE
Nitrite, UA: NEGATIVE
PH UA: 6 (ref 5.0–8.0)
PROTEIN UA: NEGATIVE mg/dL
SPECIFIC GRAVITY, URINE: 1.03
Urobilinogen, Ur: NEGATIVE

## 2018-09-20 MED ORDER — PANTOPRAZOLE SODIUM 40 MG PO TBEC: DELAYED_RELEASE_TABLET | ORAL | 2 refills | Status: DC

## 2018-09-20 NOTE — Progress Notes (Signed)
Subjective: Chief Complaint  Patient presents with  . Annual Exam   Here for physical.  Medical team includes: Optometrist Dentist Dr.Taavon, gynecology [facility], Camelia Eng, PA-C here for primary care  She has a long history of gastric issues including ulcers, acid reflux, constipation.  Has seen [facility] prior.  Her father had a history of esophageal cancer.  Currently is taking Pepcid without relief.  In the past did well with Protonix.  She sees a pain specialist for complex regional pain syndrome, takes oxycodone as needed  She is having some ongoing left wrist pain, possibly tendinitis.  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  She is right-handed.  She has not had an updated tetanus vaccine she does not think due to concern for her complex regional pain being flared up with vaccination   She is still nursing, but not currently pregnant.    History of vitamin D deficiency, not currently taking vitamin D supplement.    Reviewed their medical, surgical, family, social, medication, and allergy history and updated chart as appropriate.  Past Medical History:  Diagnosis Date  . Allergy   . Anxiety   . Asthma   . Complication of anesthesia    tends to experience "excessive hysteria" post op  . Constipation   . CRPS (complex regional pain syndrome type I)    [place], Dr. Fredric Mare  . Depression   . Family history of cancer    esophageal, father  . Gastric ulcer   . GERD (gastroesophageal reflux disease)   . Panic disorder   . Renal calculus   . Wears glasses     Past Surgical History:  Procedure Laterality Date  . ABLATION ON ENDOMETRIOSIS N/A 01/06/2013   Procedure: Robotic-Assisted Resection of Endometriosis;  Surgeon: Lovenia Kim, MD;  Location: [facility];  Service: Gynecology;  Laterality: N/A;  . ESOPHAGOGASTRODUODENOSCOPY     2 prior, last in 2016, most recent eval with [facility]  . kidney  stent placement     with later removal  . LITHOTRIPSY     x 2  . ROBOTIC ASSISTED LAPAROSCOPIC LYSIS OF ADHESION N/A 01/06/2013   Procedure: Robotic-Assisted Laparoscopic Lysis of Adhesions;  Surgeon: Lovenia Kim, MD;  Location: [facility];  Service: Gynecology;  Laterality: N/A;  . TENDON RECONSTRUCTION  10/2015   right foot    Social History   Socioeconomic History  . Marital status: Married    Spouse name: Not on file  . Number of children: 1  . Years of education: Not on file  . Highest education level: Not on file  Occupational History    Employer: [employer]  Social Needs  . Financial resource strain: Not on file  . Food insecurity:    Worry: Not on file    Inability: Not on file  . Transportation needs:    Medical: Not on file    Non-medical: Not on file  Tobacco Use  . Smoking status: Former Smoker    Last attempt to quit: 2017    Years since quitting: 2.8  . Smokeless tobacco: Never Used  . Tobacco comment: prior social smoker, not prior regular smoker  Substance and Sexual Activity  . Alcohol use: No    Comment: occasional  . Drug use: Not Currently    Types: Marijuana    Comment: 2 times weekly   . Sexual activity: Not on file  Lifestyle  . Physical activity:    Days per  week: Not on file    Minutes per session: Not on file  . Stress: Not on file  Relationships  . Social connections:    Talks on phone: Not on file    Gets together: Not on file    Attends religious service: Not on file    Active member of club or organization: Not on file    Attends meetings of clubs or organizations: Not on file    Relationship status: Not on file  . Intimate partner violence:    Fear of current or ex partner: Not on file    Emotionally abused: Not on file    Physically abused: Not on file    Forced sexual activity: Not on file  Other Topics Concern  . 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  Photographer, was model and actress prior.   Exercise - walks, goes to gym 2 days per week.   Diet - most home cooked, healthy.   09/2018    Family History  Problem Relation Age of Onset  . Diabetes Paternal Grandfather   . Diabetes Maternal Grandfather   . COPD Maternal Grandfather   . Esophageal cancer Father   . GER disease Father   . Barrett's esophagus Father   . Hypertension Father   . GER disease Mother   . Depression Mother   . Hyperlipidemia Mother   . Ulcers Brother   . Sleep apnea Brother   . GER disease Brother   . COPD Maternal Grandmother   . Dementia Maternal Grandmother   . GER disease Son   . Other Son        anal stenosis  . Heart disease Neg Hx   . Stroke Neg Hx      Current Outpatient Medications:  .  famotidine (PEPCID AC) 10 MG chewable tablet, Chew 1 tablet (10 mg total) by mouth as needed for heartburn (take with each dose MOTRIN)., Disp: 90 tablet, Rfl: 2 .  oxyCODONE-acetaminophen (PERCOCET/ROXICET) 5-325 MG tablet, Take 1 tablet by mouth every 4 (four) hours as needed for severe pain., Disp: , Rfl:  .  pantoprazole (PROTONIX) 40 MG tablet, 1 tablet daily po 45 min prior to breakfast, Disp: 30 tablet, Rfl: 2  Allergies  Allergen Reactions  . Lodine [Etodolac] Anaphylaxis and Rash    Has taken ibuprofen in past with no problems, per patient  . Cortisone Other (See Comments)    Questionable allergy?     Review of Systems Constitutional: -fever, -chills, -sweats, -unexpected weight change, -decreased appetite, -fatigue Allergy: -sneezing, -itching, -congestion Dermatology: -changing moles, --rash, -lumps ENT: -runny nose, -ear pain, -sore throat, -hoarseness, -sinus pain, -teeth pain, - ringing in ears, -hearing loss, -nosebleeds Cardiology: -chest pain, -palpitations, -swelling, -difficulty breathing when lying flat, -waking up short of breath Respiratory: -cough, -shortness of breath, -difficulty breathing with exercise or exertion, -wheezing, -coughing up blood Gastroenterology:  -abdominal pain, -nausea, -vomiting, -diarrhea, -constipation, -blood in stool, -changes in bowel movement, -difficulty swallowing or eating Hematology: -bleeding, -bruising  Musculoskeletal: +joint aches, -muscle aches, -joint swelling, -back pain, -neck pain, -cramping, -changes in gait Ophthalmology: denies vision changes, eye redness, itching, discharge Urology: -burning with urination, -difficulty urinating, -blood in urine, -urinary frequency, -urgency, -incontinence Neurology: -headache, -weakness, -tingling, -numbness, -memory loss, -falls, -dizziness Psychology: -depressed mood, -agitation, -sleep problems Breast/gyn: -breast tenderness, -discharge, -lumps, -vaginal discharge,- irregular periods, -heavy periods     Objective:  BP 110/70   Pulse 82   Temp  98.6 F (37 C) (Oral)   Ht _0  (1.626 m)   Wt 147 lb 9.6 oz (67 kg)   LMP 09/20/2018 (Exact Date)   SpO2 98%   Breastfeeding? Yes   BMI 25.34 kg/m   General appearance: alert, no distress, WD/WN, Caucasian female Skin: Scattered macules, no worrisome lesions HEENT: normocephalic, conjunctiva/corneas normal, sclerae anicteric, PERRLA, EOMi, nares patent, no discharge or erythema, pharynx normal Oral cavity: MMM, tongue normal, teeth normal Neck: supple, no lymphadenopathy, no thyromegaly, no masses, normal ROM, no bruits Chest: non tender, normal shape and expansion Heart: RRR, normal S1, S2, no murmurs Lungs: CTA bilaterally, no wheezes, rhonchi, or rales Abdomen: +bs, soft, non tender, non distended, no masses, no hepatomegaly, no splenomegaly, no bruits Back: non tender, normal ROM, no scoliosis Musculoskeletal: Tender over left medial wrist, pain with wrist range of motion, no swelling, no other bony deformities upper extremities non tender, no obvious deformity, normal ROM throughout, lower extremities non tender, no obvious deformity, normal ROM throughout Extremities: no edema, no cyanosis, no clubbing Pulses: 2+  symmetric, upper and lower extremities, normal cap refill Neurological: alert, oriented x 3, CN2-12 intact, strength normal upper extremities and lower extremities, sensation normal throughout, DTRs 2+ throughout, no cerebellar signs, gait normal Psychiatric: normal affect, behavior normal, pleasant  Breast/gyn/rectal - deferred to gynecology    Assessment and Plan :   Encounter Diagnoses  Name Primary?  . Encounter for health maintenance examination in adult Yes  . Chronic gastric ulcer without hemorrhage and without perforation   . Gastroesophageal reflux disease, esophagitis presence not specified   . Complex regional pain syndrome type 1, affecting unspecified site   . Constipation, unspecified constipation type   . Family history of esophageal cancer   . History of asthma   . Lactating mother   . Vaccine counseling   . Screening for lipid disorders   . Vitamin D deficiency   . Wrist tendonitis     Physical exam - discussed and counseled on healthy lifestyle, diet, exercise, preventative care, vaccinations, sick and well care, proper use of emergency dept and after hours care, and addressed their concerns.    Health screening: See your eye doctor yearly for routine vision care. See your dentist yearly for routine dental care including hygiene visits twice yearly. See your gynecologist yearly for routine gynecological care.  Cancer screening Pap up to date with gyn  Vaccinations: Advised yearly influenza vaccine Counseled on Td vaccine or Tdap vaccine every 10 years Patient declines Td/Tdap vaccine   Acute issues discussed: Left wrist tendonitis -advised reinforced wrist splint for the next 10 days, can alternate ice and heat therapy, over-the-counter analgesic as needed, follow-up 10 to 14 days.  Separate significant chronic issues discussed: Asthma - no flare up in > 1 year  Vit D deficiency - advised supplementation OTC, health diet  GERD, constipation - given  recent news about cancer risks with H2 blockers, we will begin Protonix.  advised updated consult with [facility]. She is a patient of record there, so she will call for updated appt  Complex regional pain syndrome - c/t routine follow up with pain clinic    Kura was seen today for annual exam.  Diagnoses and all orders for this visit:  Encounter for health maintenance examination in adult -     Lipid panel -     Basic metabolic panel -     VITAMIN D 25 Hydroxy (Vit-D Deficiency, Fractures) -     TSH -  POCT Urinalysis DIP (Proadvantage Device)  Chronic gastric ulcer without hemorrhage and without perforation  Gastroesophageal reflux disease, esophagitis presence not specified  Complex regional pain syndrome type 1, affecting unspecified site  Constipation, unspecified constipation type  Family history of esophageal cancer  History of asthma  Lactating mother  Vaccine counseling  Screening for lipid disorders -     Lipid panel  Vitamin D deficiency -     VITAMIN D 25 Hydroxy (Vit-D Deficiency, Fractures)  Wrist tendonitis  Other orders -     pantoprazole (PROTONIX) 40 MG tablet; 1 tablet daily po 45 min prior to breakfast    Follow-up pending labs, yearly for physical

## 2018-09-21 ENCOUNTER — Telehealth: Payer: Self-pay | Admitting: Medical

## 2018-09-21 ENCOUNTER — Other Ambulatory Visit: Payer: Self-pay | Admitting: Medical

## 2018-09-21 LAB — LIPID PANEL
CHOL/HDL RATIO: 3.7 ratio (ref 0.0–4.4)
CHOLESTEROL TOTAL: 150 mg/dL (ref 100–199)
HDL: 41 mg/dL (ref >39)
LDL CALC: 95 mg/dL (ref 0–99)
Triglycerides: 68 mg/dL (ref 0–149)
VLDL Cholesterol Cal: 14 mg/dL (ref 5–40)

## 2018-09-21 LAB — BASIC METABOLIC PANEL
BUN/Creatinine Ratio: 19 (ref 9–23)
BUN: 14 mg/dL (ref 6–20)
CALCIUM: 9.4 mg/dL (ref 8.7–10.2)
CO2: 20 mmol/L (ref 20–29)
Chloride: 102 mmol/L (ref 96–106)
Creatinine, Ser: 0.73 mg/dL (ref 0.57–1.00)
GFR calc Af Amer: 126 mL/min/{1.73_m2} (ref >59)
GFR calc non Af Amer: 109 mL/min/{1.73_m2} (ref >59)
Glucose: 85 mg/dL (ref 65–99)
POTASSIUM: 4.5 mmol/L (ref 3.5–5.2)
SODIUM: 137 mmol/L (ref 134–144)

## 2018-09-21 LAB — TSH: TSH: 2.61 u[IU]/mL (ref 0.450–4.500)

## 2018-09-21 LAB — VITAMIN D 25 HYDROXY (VIT D DEFICIENCY, FRACTURES): VIT D 25 HYDROXY: 28.8 ng/mL — AB (ref 30.0–100.0)

## 2018-09-21 MED ORDER — VITAMIN D 25 MCG (1000 UNIT) PO TABS: 1000.0000 [IU] | ORAL_TABLET | Freq: Every day | ORAL | 3 refills | Status: DC

## 2018-09-21 NOTE — Telephone Encounter (Signed)
Pt requested physical form to be faxed to [phone], release form signed and form was faxed

## 2018-09-28 DIAGNOSIS — R232 Flushing: Secondary | ICD-10-CM | POA: Diagnosis not present

## 2018-10-13 DIAGNOSIS — J40 Bronchitis, not specified as acute or chronic: Secondary | ICD-10-CM | POA: Diagnosis not present

## 2018-10-13 DIAGNOSIS — J329 Chronic sinusitis, unspecified: Secondary | ICD-10-CM | POA: Diagnosis not present

## 2018-11-10 NOTE — L&D Delivery Note (Signed)
Delivery Note At 4:17 PM a viable and healthy female was delivered via Vaginal, Spontaneous (Presentation: ;LOA).  APGAR: 8, 9; weight 6 lb 14.4 oz (3130 g).   Placenta status: spontaneous, intact.  Cord:  nonewith the following complications: .  Cord pH: na  Anesthesia:  epidural Episiotomy: None Lacerations: 2nd degree Suture Repair: 2.0 vicryl rapide Est. Blood Loss (mL): 250  Mom to postpartum.  Baby to Couplet care / Skin to Skin.  Chales Pelissier J 08/17/2019, 5:46 PM

## 2018-11-12 ENCOUNTER — Ambulatory Visit (INDEPENDENT_AMBULATORY_CARE_PROVIDER_SITE_OTHER): Payer: BLUE CROSS/BLUE SHIELD | Admitting: Family Medicine

## 2018-11-12 ENCOUNTER — Encounter: Payer: Self-pay | Admitting: Family Medicine

## 2018-11-12 VITALS — BP 118/80 | HR 96 | Temp 98.3°F | Ht 64.0 in | Wt 153.6 lb

## 2018-11-12 DIAGNOSIS — R05 Cough: Secondary | ICD-10-CM | POA: Diagnosis not present

## 2018-11-12 DIAGNOSIS — J4520 Mild intermittent asthma, uncomplicated: Secondary | ICD-10-CM

## 2018-11-12 DIAGNOSIS — R058 Other specified cough: Secondary | ICD-10-CM

## 2018-11-12 DIAGNOSIS — J0140 Acute pansinusitis, unspecified: Secondary | ICD-10-CM | POA: Diagnosis not present

## 2018-11-12 DIAGNOSIS — N809 Endometriosis, unspecified: Secondary | ICD-10-CM | POA: Diagnosis not present

## 2018-11-12 MED ORDER — AZITHROMYCIN 250 MG PO TABS: ORAL_TABLET | ORAL | 0 refills | Status: DC

## 2018-11-12 MED ORDER — ALBUTEROL SULFATE HFA 108 (90 BASE) MCG/ACT IN AERS
2.0000 | INHALATION_SPRAY | Freq: Four times a day (QID) | RESPIRATORY_TRACT | 0 refills | Status: DC | PRN
Start: 2018-11-12 — End: 2018-12-06

## 2018-11-12 NOTE — Patient Instructions (Addendum)
Take the antibiotic as prescribed. Hydrate. Use the albuterol inhaler if needed.  You may also need to use Flonase nasal spray.   Azithromycin tablets What is this medicine? AZITHROMYCIN (az ith roe MYE sin) is a macrolide antibiotic. It is used to treat or prevent certain kinds of bacterial infections. It will not work for colds, flu, or other viral infections. This medicine may be used for other purposes; ask your health care provider or pharmacist if you have questions. COMMON BRAND NAME(S): Zithromax, Zithromax Tri-Pak, Zithromax Z-Pak What should I tell my health care provider before I take this medicine? They need to know if you have any of these conditions: -history of blood diseases, like leukemia -history of irregular heartbeat -kidney disease -liver disease -myasthenia gravis -an unusual or allergic reaction to azithromycin, erythromycin, other macrolide antibiotics, foods, dyes, or preservatives -pregnant or trying to get pregnant -breast-feeding How should I use this medicine? Take this medicine by mouth with a full glass of water. Follow the directions on the prescription label. The tablets can be taken with food or on an empty stomach. If the medicine upsets your stomach, take it with food. Take your medicine at regular intervals. Do not take your medicine more often than directed. Take all of your medicine as directed even if you think your are better. Do not skip doses or stop your medicine early. Talk to your pediatrician regarding the use of this medicine in children. While this drug may be prescribed for children as young as 6 months for selected conditions, precautions do apply. Overdosage: If you think you have taken too much of this medicine contact a poison control center or emergency room at once. NOTE: This medicine is only for you. Do not share this medicine with others. What if I miss a dose? If you miss a dose, take it as soon as you can. If it is almost time for  your next dose, take only that dose. Do not take double or extra doses. What may interact with this medicine? Do not take this medicine with any of the following medications: -cisapride -dronedarone -pimozide -thioridazine This medicine may also interact with the following medications: -antacids that contain aluminum or magnesium -birth control pills -certain medicines for irregular heart beat like amiodarone, dofetilide, encainide, flecainide, propafenone, quinidine -colchicine -cyclosporine -digoxin -nelfinavir -phenytoin -warfarin This list may not describe all possible interactions. Give your health care provider a list of all the medicines, herbs, non-prescription drugs, or dietary supplements you use. Also tell them if you smoke, drink alcohol, or use illegal drugs. Some items may interact with your medicine. What should I watch for while using this medicine? Tell your doctor or healthcare professional if your symptoms do not start to get better or if they get worse. Do not treat diarrhea with over the counter products. Contact your doctor if you have diarrhea that lasts more than 2 days or if it is severe and watery. This medicine can make you more sensitive to the sun. Keep out of the sun. If you cannot avoid being in the sun, wear protective clothing and use sunscreen. Do not use sun lamps or tanning beds/booths. What side effects may I notice from receiving this medicine? Side effects that you should report to your doctor or health care professional as soon as possible: -allergic reactions like skin rash, itching or hives, swelling of the face, lips, or tongue -bloody or watery diarrhea -breathing problems -chest pain -fast, irregular heartbeat -muscle weakness -redness, blistering, peeling or  loosening of the skin, including inside the mouth -signs and symptoms of liver injury like dark yellow or brown urine; general ill feeling or flu-like symptoms; light-colored stools;  loss of appetite; nausea; right upper belly pain; unusually weak or tired; yellowing of the eyes or skin -white patches or sores in the mouth -unusually weak or tired Side effects that usually do not require medical attention (report to your doctor or health care professional if they continue or are bothersome): -diarrhea -nausea -stomach pain -vomiting This list may not describe all possible side effects. Call your doctor for medical advice about side effects. You may report side effects to FDA at 1-800-FDA-1088. Where should I keep my medicine? Keep out of the reach of children. Store at room temperature between 15 and 30 degrees C (59 and 86 degrees F). Throw away any unused medicine after the expiration date. NOTE: This sheet is a summary. It may not cover all possible information. If you have questions about this medicine, talk to your doctor, pharmacist, or health care provider.  2019 Elsevier/Gold Standard (2018-03-05 15:21:46)

## 2018-11-12 NOTE — Progress Notes (Signed)
Chief Complaint  Patient presents with  . Sinus Problem    runny nose, cough,sore throat facial pain, greenish mucus,ear pressure     Subjective:  Amber Pope is a 33 y.o. female who presents for possible sinus infection and bronchitis.  Complains of a one week history of rhinorrhea, sore throat, and coughing up green sputum.  Denies fever, chills, ear pain, chest pain, palpitations, shortness of breath, wheezing, abdominal pain, N/V/D.   Does not have her albuterol inhaler at home. Underlying asthma. Normally well controlled.   She is breastfeeding.  LMP: currently on her cycle.   States she took Augmentin and course of oral prednisone on December 4th for similar symptoms.  States she did not complete the Augmentin because her son who she is breastfeeding had a bad reaction to it.  States she only took Augmentin for 3-4 days.   Past history is significant for asthma. Patient is a non-smoker.  Using Mucinex and Nyquil for symptoms.  Denies sick contacts.  No other aggravating or relieving factors.  No other c/o.  ROS as in subjective   Objective: Vitals:   11/12/18 1335  BP: 118/80  Pulse: 96  Temp: 98.3 F (36.8 C)  SpO2: 99%    General appearance: Alert, WD/WN, no distress                             Skin: warm, no rash                           Head: + frontal and maxillary sinus tenderness,                            Eyes: conjunctiva normal, corneas clear, PERRLA                            Ears: pearly TMs, external ear canals normal                          Nose: septum midline, turbinates swollen, with erythema and clear discharge             Mouth/throat: MMM, tongue normal, mild pharyngeal erythema, no edema                           Neck: supple, no adenopathy, no thyromegaly, tender bilateral anterior cervical lymph nodes                          Heart: RRR, normal S1, S2, no murmurs                         Lungs: CTA bilaterally, no wheezes, rales, or  rhonchi       Assessment and Plan: Acute pansinusitis, recurrence not specified - Plan: azithromycin (ZITHROMAX) 250 MG tablet  Productive cough - Plan: azithromycin (ZITHROMAX) 250 MG tablet  Mild intermittent asthma without complication - Plan: albuterol (PROVENTIL HFA;VENTOLIN HFA) 108 (90 Base) MCG/ACT inhaler    Prescription sent for Z-pak and albuterol inhaler. States she is lactating and her son had severe rash related to Augmentin. We did discuss just doing supportive care for several more days vs starting on an antibiotic since she  is breastfeeding and she prefers to try an antibiotic.  Can use OTC Mucinex for congestion.  Tylenol or Ibuprofen OTC for fever and malaise.  Discussed symptomatic relief, nasal saline flush, and call or return if worse or not back to baseline 10 days after completing the antibiotic.

## 2018-11-15 ENCOUNTER — Telehealth: Payer: Self-pay | Admitting: Medical

## 2018-11-15 MED ORDER — PROMETHAZINE-DM 6.25-15 MG/5ML PO SYRP: 5.0000 mL | ORAL_SOLUTION | Freq: Every day | ORAL | 0 refills | Status: DC

## 2018-11-15 NOTE — Telephone Encounter (Signed)
Pt called and states they is still coughing really bad and it is keeping her up at night, she is taking nyquil and dayquil in noting is helping, she is wondering if you could give her come cough syrup, pt uses   [id number] - [place], [place] and pt can be reached at [phone]

## 2018-11-15 NOTE — Telephone Encounter (Signed)
Please ask her if she has tried Mucinex. Ok to send in Promethazine DM for her to use short term at bedtime for cough. This is sedating and not recommended to breast feeding long term. No human studies available to assess risk of infant harm. May decrease mild production as well.

## 2018-11-15 NOTE — Telephone Encounter (Signed)
Pt has tried municex for the last 5 days. She wants something to sleep. Sent in med

## 2018-11-15 NOTE — Telephone Encounter (Signed)
Left message for pt to call me back 

## 2018-11-16 ENCOUNTER — Telehealth: Payer: Self-pay | Admitting: Family Medicine

## 2018-11-16 NOTE — Telephone Encounter (Signed)
That medicine does not have a cough suppressant in it.  Have her check with her OB/GYN concerning a cough med that she can take.

## 2018-11-16 NOTE — Telephone Encounter (Signed)
Please call pt  Amber Pope called in cough rx yesterday and she was told there were some risks with breast feeding  Concerned about taking cough rx prescribed due to the risks/side effects she has read. Her husband was prescribed a cough med bromphenir-pseudofed and she read about it and it doesn't have the risks the other one has   Is that something that she could be given?

## 2018-11-16 NOTE — Telephone Encounter (Signed)
Left message on voicemail for patient to call back. 

## 2018-12-06 ENCOUNTER — Other Ambulatory Visit: Payer: Self-pay | Admitting: Family Medicine

## 2018-12-06 DIAGNOSIS — J4520 Mild intermittent asthma, uncomplicated: Secondary | ICD-10-CM

## 2018-12-20 ENCOUNTER — Ambulatory Visit (INDEPENDENT_AMBULATORY_CARE_PROVIDER_SITE_OTHER): Payer: BLUE CROSS/BLUE SHIELD | Admitting: Family Medicine

## 2018-12-20 ENCOUNTER — Encounter: Payer: Self-pay | Admitting: Family Medicine

## 2018-12-20 VITALS — BP 114/78 | HR 100 | Temp 98.5°F | Wt 152.8 lb

## 2018-12-20 DIAGNOSIS — J209 Acute bronchitis, unspecified: Secondary | ICD-10-CM | POA: Diagnosis not present

## 2018-12-20 MED ORDER — AMOXICILLIN-POT CLAVULANATE 875-125 MG PO TABS: 1.0000 | ORAL_TABLET | Freq: Two times a day (BID) | ORAL | 0 refills | Status: DC

## 2018-12-20 MED ORDER — BENZONATATE 100 MG PO CAPS: 200.0000 mg | ORAL_CAPSULE | Freq: Three times a day (TID) | ORAL | 0 refills | Status: DC | PRN

## 2018-12-20 NOTE — Progress Notes (Signed)
   Subjective:    Patient ID: Amber Pope, female    DOB: 1986/02/07, 33 y.o.   MRN: [id number]  HPI She has had difficulty with intermittent asthma/bronchitis since November.  Back then she was treated with an antibiotic and prednisone which got her better.  She was seen in December and January and each time did get somewhat better.  In January she was given azithromycin.  At that time she was breast-feeding however now she has not.  Today her symptoms are mainly chest congestion nasal congestion, sore throat, productive cough with some bloody tinged sputum.  She also had some vomiting and diarrhea several days ago but this is cleared up.  She also has a history of intermittent asthma.   Review of Systems     Objective:   Physical Exam Alert and in no distress. Tympanic membranes and canals are normal. Pharyngeal area is normal. Neck is supple without adenopathy or thyromegaly. Cardiac exam shows a regular sinus rhythm without murmurs or gallops. Lungs are clear to auscultation.        Assessment & Plan:  Acute bronchitis, unspecified organism - Plan: amoxicillin-clavulanate (AUGMENTIN) 875-125 MG tablet, benzonatate (TESSALON) 100 MG capsule Recommend she use the inhaler as needed to see if this will help.  She is to take the antibiotic and the cough medications and if having difficulty she will call me.  Strongly encouraged her to call if not totally back to normal when she finishes the antibiotic.

## 2018-12-20 NOTE — Patient Instructions (Signed)
Take all the antibiotic and if not totally back to normal when you finish call 

## 2018-12-27 ENCOUNTER — Telehealth: Payer: Self-pay

## 2018-12-27 DIAGNOSIS — J209 Acute bronchitis, unspecified: Secondary | ICD-10-CM

## 2018-12-27 MED ORDER — CIPROFLOXACIN HCL 500 MG PO TABS: 500.0000 mg | ORAL_TABLET | Freq: Two times a day (BID) | ORAL | 0 refills | Status: DC

## 2018-12-27 MED ORDER — HYDROCOD POLST-CPM POLST ER 10-8 MG/5ML PO SUER: 5.0000 mL | Freq: Two times a day (BID) | ORAL | 0 refills | Status: DC | PRN

## 2018-12-27 NOTE — Telephone Encounter (Signed)
Patient called and stated she was prescribed Augmentin which has caused her to vomit and she still feels sick to her stomach. She has stopped taking the medication but will need something else to replace it. She also stated that the Lavella Lemons is helping a little bit but not heloing at night with her cough. She wants to know if a cough syrup can be sent into the pharmacy for her.

## 2018-12-27 NOTE — Telephone Encounter (Signed)
Let her know that I called a different antibiotic in for her as well as a cough med and have her let us know if she is not better when she finishes this.

## 2018-12-27 NOTE — Telephone Encounter (Signed)
Left message on voicemail with info.

## 2019-01-03 DIAGNOSIS — Z3201 Encounter for pregnancy test, result positive: Secondary | ICD-10-CM | POA: Diagnosis not present

## 2019-01-03 DIAGNOSIS — Z3687 Encounter for antenatal screening for uncertain dates: Secondary | ICD-10-CM | POA: Diagnosis not present

## 2019-01-04 ENCOUNTER — Telehealth: Payer: Self-pay

## 2019-01-04 NOTE — Telephone Encounter (Signed)
Pt called and ask is her AB agumentin was safe to take due to her finding out she is pregnant . Pt was advise per JCL that it  Is. [place]

## 2019-01-08 ENCOUNTER — Encounter (HOSPITAL_COMMUNITY): Payer: Self-pay | Admitting: *Deleted

## 2019-01-08 ENCOUNTER — Inpatient Hospital Stay (HOSPITAL_COMMUNITY)
Admission: AD | Admit: 2019-01-08 | Discharge: 2019-01-09 | Discharge status: Against Medical Advice | Payer: BLUE CROSS/BLUE SHIELD | Source: Ambulatory Visit | Attending: Obstetrics and Gynecology | Admitting: Obstetrics and Gynecology

## 2019-01-08 DIAGNOSIS — R05 Cough: Secondary | ICD-10-CM | POA: Diagnosis not present

## 2019-01-08 DIAGNOSIS — Z5329 Procedure and treatment not carried out because of patient's decision for other reasons: Secondary | ICD-10-CM | POA: Diagnosis not present

## 2019-01-08 LAB — URINALYSIS, ROUTINE W REFLEX MICROSCOPIC
Bilirubin Urine: NEGATIVE
Glucose, UA: NEGATIVE mg/dL
Hgb urine dipstick: NEGATIVE
Ketones, ur: NEGATIVE mg/dL
Nitrite: NEGATIVE
Protein, ur: NEGATIVE mg/dL
Specific Gravity, Urine: 1.002 — ABNORMAL LOW (ref 1.005–1.030)
pH: 8 (ref 5.0–8.0)

## 2019-01-08 LAB — POCT PREGNANCY, URINE: Preg Test, Ur: POSITIVE — AB

## 2019-01-08 NOTE — MAU Note (Addendum)
Had viral lung infection a month ago and put on antibiotics. Did not know was pregnant and kept throwing up antibiotics. Now just have cough and doc told me to finish antibiotics but cannot keep them down. My "kidneys" hurt and have hx of stones and kidney infections so may have kidney infection. Have meds at home for nausea and reflux and nothing is helping. Constipated and tried MOM and not really helping. Has appt with Dr Ronita Hipps 3/12. Has had blood work done in office.

## 2019-01-09 NOTE — Progress Notes (Signed)
Pt unwilling to stay to see provider. Pt states she will call her provider for results. Left AMA accompanied by spouse in good condition

## 2019-01-10 DIAGNOSIS — R110 Nausea: Secondary | ICD-10-CM | POA: Diagnosis not present

## 2019-01-10 DIAGNOSIS — N898 Other specified noninflammatory disorders of vagina: Secondary | ICD-10-CM | POA: Diagnosis not present

## 2019-01-10 DIAGNOSIS — J069 Acute upper respiratory infection, unspecified: Secondary | ICD-10-CM | POA: Diagnosis not present

## 2019-01-10 DIAGNOSIS — R109 Unspecified abdominal pain: Secondary | ICD-10-CM | POA: Diagnosis not present

## 2019-01-20 DIAGNOSIS — Z3201 Encounter for pregnancy test, result positive: Secondary | ICD-10-CM | POA: Diagnosis not present

## 2019-01-25 DIAGNOSIS — Z3481 Encounter for supervision of other normal pregnancy, first trimester: Secondary | ICD-10-CM | POA: Diagnosis not present

## 2019-01-25 DIAGNOSIS — Z3689 Encounter for other specified antenatal screening: Secondary | ICD-10-CM | POA: Diagnosis not present

## 2019-01-25 LAB — OB RESULTS CONSOLE RPR: RPR: NONREACTIVE

## 2019-01-25 LAB — OB RESULTS CONSOLE ABO/RH: RH Type: NEGATIVE

## 2019-01-25 LAB — OB RESULTS CONSOLE ANTIBODY SCREEN: Antibody Screen: NEGATIVE

## 2019-01-25 LAB — OB RESULTS CONSOLE HIV ANTIBODY (ROUTINE TESTING): HIV: NONREACTIVE

## 2019-01-25 LAB — OB RESULTS CONSOLE HEPATITIS B SURFACE ANTIGEN: Hepatitis B Surface Ag: NEGATIVE

## 2019-01-25 LAB — OB RESULTS CONSOLE RUBELLA ANTIBODY, IGM: Rubella: IMMUNE

## 2019-02-07 DIAGNOSIS — Z3689 Encounter for other specified antenatal screening: Secondary | ICD-10-CM | POA: Diagnosis not present

## 2019-02-17 DIAGNOSIS — Z3481 Encounter for supervision of other normal pregnancy, first trimester: Secondary | ICD-10-CM | POA: Diagnosis not present

## 2019-02-17 DIAGNOSIS — Z3682 Encounter for antenatal screening for nuchal translucency: Secondary | ICD-10-CM | POA: Diagnosis not present

## 2019-03-09 DIAGNOSIS — O3442 Maternal care for other abnormalities of cervix, second trimester: Secondary | ICD-10-CM | POA: Diagnosis not present

## 2019-03-09 DIAGNOSIS — Z3A16 16 weeks gestation of pregnancy: Secondary | ICD-10-CM | POA: Diagnosis not present

## 2019-03-09 DIAGNOSIS — Z361 Encounter for antenatal screening for raised alphafetoprotein level: Secondary | ICD-10-CM | POA: Diagnosis not present

## 2019-04-01 DIAGNOSIS — Z3A19 19 weeks gestation of pregnancy: Secondary | ICD-10-CM | POA: Diagnosis not present

## 2019-04-01 DIAGNOSIS — O3442 Maternal care for other abnormalities of cervix, second trimester: Secondary | ICD-10-CM | POA: Diagnosis not present

## 2019-04-07 ENCOUNTER — Other Ambulatory Visit: Payer: Self-pay | Admitting: Obstetrics and Gynecology

## 2019-04-07 DIAGNOSIS — R109 Unspecified abdominal pain: Secondary | ICD-10-CM

## 2019-04-07 DIAGNOSIS — O9989 Other specified diseases and conditions complicating pregnancy, childbirth and the puerperium: Secondary | ICD-10-CM | POA: Diagnosis not present

## 2019-04-07 DIAGNOSIS — R319 Hematuria, unspecified: Secondary | ICD-10-CM

## 2019-04-07 DIAGNOSIS — O3402 Maternal care for unspecified congenital malformation of uterus, second trimester: Secondary | ICD-10-CM | POA: Diagnosis not present

## 2019-04-07 DIAGNOSIS — O99891 Other specified diseases and conditions complicating pregnancy: Secondary | ICD-10-CM

## 2019-04-07 DIAGNOSIS — M549 Dorsalgia, unspecified: Secondary | ICD-10-CM

## 2019-04-07 DIAGNOSIS — Z3A20 20 weeks gestation of pregnancy: Secondary | ICD-10-CM | POA: Diagnosis not present

## 2019-04-08 ENCOUNTER — Other Ambulatory Visit: Payer: Self-pay

## 2019-04-08 ENCOUNTER — Ambulatory Visit
Admission: RE | Admit: 2019-04-08 | Discharge: 2019-04-08 | Disposition: A | Discharge status: Home / Self Care | Payer: BLUE CROSS/BLUE SHIELD | Source: Ambulatory Visit | Attending: Obstetrics and Gynecology | Admitting: Obstetrics and Gynecology

## 2019-04-08 DIAGNOSIS — R109 Unspecified abdominal pain: Secondary | ICD-10-CM | POA: Diagnosis not present

## 2019-04-08 DIAGNOSIS — O99891 Other specified diseases and conditions complicating pregnancy: Secondary | ICD-10-CM

## 2019-04-08 DIAGNOSIS — R319 Hematuria, unspecified: Secondary | ICD-10-CM

## 2019-04-08 DIAGNOSIS — M549 Dorsalgia, unspecified: Secondary | ICD-10-CM

## 2019-04-11 ENCOUNTER — Other Ambulatory Visit: Payer: BLUE CROSS/BLUE SHIELD

## 2019-04-22 ENCOUNTER — Telehealth: Payer: Self-pay | Admitting: Medical

## 2019-04-22 NOTE — Telephone Encounter (Signed)
Dismissal letter in guarantor snapshot  °

## 2019-04-25 DIAGNOSIS — Z3A22 22 weeks gestation of pregnancy: Secondary | ICD-10-CM | POA: Diagnosis not present

## 2019-04-25 DIAGNOSIS — O9989 Other specified diseases and conditions complicating pregnancy, childbirth and the puerperium: Secondary | ICD-10-CM | POA: Diagnosis not present

## 2019-04-25 DIAGNOSIS — O3402 Maternal care for unspecified congenital malformation of uterus, second trimester: Secondary | ICD-10-CM | POA: Diagnosis not present

## 2019-04-25 DIAGNOSIS — R109 Unspecified abdominal pain: Secondary | ICD-10-CM | POA: Diagnosis not present

## 2019-04-26 DIAGNOSIS — Z3A22 22 weeks gestation of pregnancy: Secondary | ICD-10-CM | POA: Diagnosis not present

## 2019-04-26 DIAGNOSIS — O26892 Other specified pregnancy related conditions, second trimester: Secondary | ICD-10-CM | POA: Diagnosis not present

## 2019-04-26 DIAGNOSIS — R109 Unspecified abdominal pain: Secondary | ICD-10-CM | POA: Diagnosis not present

## 2019-04-26 DIAGNOSIS — R102 Pelvic and perineal pain: Secondary | ICD-10-CM | POA: Diagnosis not present

## 2019-04-26 DIAGNOSIS — O26899 Other specified pregnancy related conditions, unspecified trimester: Secondary | ICD-10-CM | POA: Diagnosis not present

## 2019-04-26 DIAGNOSIS — O3442 Maternal care for other abnormalities of cervix, second trimester: Secondary | ICD-10-CM | POA: Diagnosis not present

## 2019-05-15 ENCOUNTER — Inpatient Hospital Stay (HOSPITAL_COMMUNITY)
Admission: AD | Admit: 2019-05-15 | Discharge: 2019-05-15 | Disposition: A | Discharge status: Home / Self Care | Payer: BC Managed Care – PPO | Attending: Obstetrics & Gynecology | Admitting: Obstetrics & Gynecology

## 2019-05-15 ENCOUNTER — Other Ambulatory Visit: Payer: Self-pay

## 2019-05-15 ENCOUNTER — Encounter (HOSPITAL_COMMUNITY): Payer: Self-pay | Admitting: *Deleted

## 2019-05-15 DIAGNOSIS — Z3A25 25 weeks gestation of pregnancy: Secondary | ICD-10-CM | POA: Insufficient documentation

## 2019-05-15 DIAGNOSIS — N898 Other specified noninflammatory disorders of vagina: Secondary | ICD-10-CM | POA: Insufficient documentation

## 2019-05-15 DIAGNOSIS — N859 Noninflammatory disorder of uterus, unspecified: Secondary | ICD-10-CM

## 2019-05-15 DIAGNOSIS — Z6791 Unspecified blood type, Rh negative: Secondary | ICD-10-CM | POA: Diagnosis not present

## 2019-05-15 DIAGNOSIS — N858 Other specified noninflammatory disorders of uterus: Secondary | ICD-10-CM

## 2019-05-15 DIAGNOSIS — O360920 Maternal care for other rhesus isoimmunization, second trimester, not applicable or unspecified: Secondary | ICD-10-CM

## 2019-05-15 DIAGNOSIS — O26892 Other specified pregnancy related conditions, second trimester: Secondary | ICD-10-CM | POA: Diagnosis not present

## 2019-05-15 LAB — POCT FERN TEST: POCT Fern Test: NEGATIVE

## 2019-05-15 LAB — URINALYSIS, ROUTINE W REFLEX MICROSCOPIC
Bilirubin Urine: NEGATIVE
Glucose, UA: NEGATIVE mg/dL
Hgb urine dipstick: NEGATIVE
Ketones, ur: NEGATIVE mg/dL
Nitrite: NEGATIVE
Protein, ur: NEGATIVE mg/dL
Specific Gravity, Urine: 1.013 (ref 1.005–1.030)
pH: 7 (ref 5.0–8.0)

## 2019-05-15 LAB — AMNISURE RUPTURE OF MEMBRANE (ROM) NOT AT ARMC: Amnisure ROM: NEGATIVE

## 2019-05-15 MED ORDER — RHO D IMMUNE GLOBULIN 1500 UNIT/2ML IJ SOSY
300.0000 ug | PREFILLED_SYRINGE | Freq: Once | INTRAMUSCULAR | Status: AC
  Administered 2019-05-15: 300 ug via INTRAMUSCULAR
  Filled 2019-05-15: qty 2

## 2019-05-15 NOTE — Discharge Instructions (Signed)
Braxton Hicks Contractions °Contractions of the uterus can occur throughout pregnancy, but they are not always a sign that you are in labor. You may have practice contractions called Braxton Hicks contractions. These false labor contractions are sometimes confused with true labor. °What are Braxton Hicks contractions? °Braxton Hicks contractions are tightening movements that occur in the muscles of the uterus before labor. Unlike true labor contractions, these contractions do not result in opening (dilation) and thinning of the cervix. Toward the end of pregnancy (32-34 weeks), Braxton Hicks contractions can happen more often and may become stronger. These contractions are sometimes difficult to tell apart from true labor because they can be very uncomfortable. You should not feel embarrassed if you go to the hospital with false labor. °Sometimes, the only way to tell if you are in true labor is for your health care provider to look for changes in the cervix. The health care provider will do a physical exam and may monitor your contractions. If you are not in true labor, the exam should show that your cervix is not dilating and your water has not broken. °If there are no other health problems associated with your pregnancy, it is completely safe for you to be sent home with false labor. You may continue to have Braxton Hicks contractions until you go into true labor. °How to tell the difference between true labor and false labor °True labor °· Contractions last 30-70 seconds. °· Contractions become very regular. °· Discomfort is usually felt in the top of the uterus, and it spreads to the lower abdomen and low back. °· Contractions do not go away with walking. °· Contractions usually become more intense and increase in frequency. °· The cervix dilates and gets thinner. °False labor °· Contractions are usually shorter and not as strong as true labor contractions. °· Contractions are usually irregular. °· Contractions  are often felt in the front of the lower abdomen and in the groin. °· Contractions may go away when you walk around or change positions while lying down. °· Contractions get weaker and are shorter-lasting as time goes on. °· The cervix usually does not dilate or become thin. °Follow these instructions at home: ° °· Take over-the-counter and prescription medicines only as told by your health care provider. °· Keep up with your usual exercises and follow other instructions from your health care provider. °· Eat and drink lightly if you think you are going into labor. °· If Braxton Hicks contractions are making you uncomfortable: °? Change your position from lying down or resting to walking, or change from walking to resting. °? Sit and rest in a tub of warm water. °? Drink enough fluid to keep your urine pale yellow. Dehydration may cause these contractions. °? Do slow and deep breathing several times an hour. °· Keep all follow-up prenatal visits as told by your health care provider. This is important. °Contact a health care provider if: °· You have a fever. °· You have continuous pain in your abdomen. °Get help right away if: °· Your contractions become stronger, more regular, and closer together. °· You have fluid leaking or gushing from your vagina. °· You pass blood-tinged mucus (bloody show). °· You have bleeding from your vagina. °· You have low back pain that you never had before. °· You feel your baby’s head pushing down and causing pelvic pressure. °· Your baby is not moving inside you as much as it used to. °Summary °· Contractions that occur before labor are   called Braxton Hicks contractions, false labor, or practice contractions.  Braxton Hicks contractions are usually shorter, weaker, farther apart, and less regular than true labor contractions. True labor contractions usually become progressively stronger and regular, and they become more frequent.  Manage discomfort from [facility] contractions  by changing position, resting in a warm bath, drinking plenty of water, or practicing deep breathing. This information is not intended to replace advice given to you by your health care provider. Make sure you discuss any questions you have with your health care provider. Document Released: 03/12/2017 Document Revised: 10/09/2017 Document Reviewed: 03/12/2017 Elsevier Patient Education  2020 [employer].

## 2019-05-15 NOTE — MAU Provider Note (Signed)
History     CSN: [id number]  Arrival date and time: 05/15/19 1607   First Provider Initiated Contact with Patient 05/15/19 1926      Chief Complaint  Patient presents with  . Vaginal Bleeding  . Possible Pregnancy  . Abdominal Pain   HPI [name] is a 33 y.o. G2P1001 at [redacted]w[redacted]d who presents to MAU today with complaint of possible LOF, one episode of bleeding yesterday and mild cramping and abdominal tightening since this morning. The patient states that she was on vacation and walked a lot yesterday. She has a history of foot surgery which has prevented her from much activity until recently. She did not wear an abdominal binder. She has a history of short cervix with last pregnancy and then ended up delivering at full-term. The patient had her last cervical length [place] in the office on 6/16 and length was normal. She states that she had one episode of dark blood noted last night around 8pm and none since. She has not had a gush of fluid, but did not a constant wet feeling since last night. She had a LEEP procedure in 2015. Last intercourse was Wednesday.   OB History    Gravida  2   Para  1   Term  1   Preterm      AB      Living  1     SAB      TAB      Ectopic      Multiple  0   Live Births  1           Past Medical History:  Diagnosis Date  . Allergy   . Anxiety   . Asthma   . Complication of anesthesia    tends to experience "excessive hysteria" post op  . Constipation   . Constipation   . CRPS (complex regional pain syndrome type I)    [place], Dr. Fredric Mare  . Depression   . Family history of cancer    esophageal, father  . Gastric ulcer   . GERD (gastroesophageal reflux disease)   . GERD (gastroesophageal reflux disease)   . Panic disorder   . Renal calculus   . Wears glasses     Past Surgical History:  Procedure Laterality Date  . ABLATION ON ENDOMETRIOSIS N/A 01/06/2013   Procedure: Robotic-Assisted Resection of  Endometriosis;  Surgeon: Lovenia Kim, MD;  Location: [facility];  Service: Gynecology;  Laterality: N/A;  . ESOPHAGOGASTRODUODENOSCOPY     2 prior, last in 2016, most recent eval with [facility]  . kidney stent placement     with later removal  . LITHOTRIPSY     x 2  . ROBOTIC ASSISTED LAPAROSCOPIC LYSIS OF ADHESION N/A 01/06/2013   Procedure: Robotic-Assisted Laparoscopic Lysis of Adhesions;  Surgeon: Lovenia Kim, MD;  Location: [facility];  Service: Gynecology;  Laterality: N/A;  . TENDON RECONSTRUCTION  10/2015   right foot    Family History  Problem Relation Age of Onset  . Diabetes Paternal Grandfather   . Diabetes Maternal Grandfather   . COPD Maternal Grandfather   . Esophageal cancer Father   . GER disease Father   . Barrett's esophagus Father   . Hypertension Father   . GER disease Mother   . Depression Mother   . Hyperlipidemia Mother   . Ulcers Brother   . Sleep apnea Brother   . GER disease Brother   . COPD  Maternal Grandmother   . Dementia Maternal Grandmother   . GER disease Son   . Other Son        anal stenosis  . Heart disease Neg Hx   . Stroke Neg Hx     Social History   Tobacco Use  . Smoking status: Former Smoker    Quit date: 2017    Years since quitting: 3.5  . Smokeless tobacco: Never Used  . Tobacco comment: prior social smoker, not prior regular smoker  Substance Use Topics  . Alcohol use: No    Comment: occasional  . Drug use: Not Currently    Types: Marijuana    Comment: 2 times weekly     Allergies:  Allergies  Allergen Reactions  . Lodine [Etodolac] Anaphylaxis and Rash    Has taken ibuprofen in past with no problems, per patient  . Cortisone Other (See Comments)    Questionable allergy?    Medications Prior to Admission  Medication Sig Dispense Refill Last Dose  . ALPRAZolam (XANAX) 0.5 MG tablet Take 0.5 mg by mouth at bedtime as needed for anxiety. Pt taking half of the 0.5     . aluminum hydroxide-magnesium carbonate  (GAVISCON) 95-358 MG/15ML SUSP Take by mouth.     Marland Kitchen azithromycin (ZITHROMAX) 250 MG tablet Take 2 tablets on day 1, then 1 tablet the remaining days. (Patient not taking: Reported on 12/20/2018) 6 tablet 0   . benzonatate (TESSALON) 100 MG capsule Take 2 capsules (200 mg total) by mouth 3 (three) times daily as needed for cough. 20 capsule 0   . chlorpheniramine-HYDROcodone (TUSSIONEX PENNKINETIC ER) 10-8 MG/5ML SUER Take 5 mLs by mouth every 12 (twelve) hours as needed for cough. 140 mL 0   . cholecalciferol (VITAMIN D3) 25 MCG (1000 UT) tablet Take 1 tablet (1,000 Units total) by mouth daily. (Patient not taking: Reported on 12/20/2018) 90 tablet 3   . ciprofloxacin (CIPRO) 500 MG tablet Take 1 tablet (500 mg total) by mouth 2 (two) times daily. 20 tablet 0   . doxylamine, Sleep, (UNISOM) 25 MG tablet Take 25 mg by mouth at bedtime as needed.     . famotidine (PEPCID) 20 MG tablet Take 20 mg by mouth 2 (two) times daily.     . Multiple Vitamin (VITAMIN E/FOLIC IWOE/H-2/Z-22 PO) Take by mouth.     . ondansetron (ZOFRAN-ODT) 4 MG disintegrating tablet Take 4 mg by mouth every 8 (eight) hours as needed for nausea or vomiting.     . pantoprazole (PROTONIX) 40 MG tablet 1 tablet daily po 45 min prior to breakfast 30 tablet 2   . promethazine (PHENERGAN) 25 MG tablet Take 25 mg by mouth every 6 (six) hours as needed for nausea or vomiting.     . promethazine-dextromethorphan (PROMETHAZINE-DM) 6.25-15 MG/5ML syrup Take 5 mLs by mouth at bedtime. (Patient not taking: Reported on 12/20/2018) 118 mL 0   . VENTOLIN HFA 108 (90 Base) MCG/ACT inhaler TAKE 2 PUFFS BY MOUTH EVERY 6 HOURS AS NEEDED FOR WHEEZE OR SHORTNESS OF BREATH 18 Inhaler 0     Review of Systems  Constitutional: Negative for fever.  Gastrointestinal: Positive for abdominal pain. Negative for constipation, diarrhea, nausea and vomiting.  Genitourinary: Positive for vaginal bleeding and vaginal discharge.   Physical Exam   Blood pressure  123/71, pulse 88, temperature 98.3 F (36.8 C), temperature source Oral, resp. rate 18, weight 74.6 kg, SpO2 100 %, currently breastfeeding.  Physical Exam  Nursing note and vitals reviewed.  Constitutional: She is oriented to person, place, and time. She appears well-developed and well-nourished. No distress.  HENT:  Head: Normocephalic and atraumatic.  Cardiovascular: Normal rate.  Respiratory: Effort normal.  GI: Soft. She exhibits no distension and no mass. There is no abdominal tenderness. There is no rebound and no guarding.  Genitourinary: Uterus is enlarged. Uterus is not tender. Cervix exhibits no motion tenderness, no discharge and no friability.    Vaginal discharge (small, white, mucous - no pooling) present.     No vaginal bleeding.  No bleeding in the vagina.  Neurological: She is alert and oriented to person, place, and time.  Skin: Skin is warm and dry. No erythema.  Psychiatric: She has a normal mood and affect.    Results for orders placed or performed during the hospital encounter of 05/15/19 (from the past 24 hour(s))  Fern Test     Status: None   Collection Time: 05/15/19  7:51 PM  Result Value Ref Range   POCT Fern Test Negative = intact amniotic membranes   Amnisure rupture of membrane (rom)not at [facility]     Status: None   Collection Time: 05/15/19  8:02 PM  Result Value Ref Range   Amnisure ROM NEGATIVE   Rh IG workup (includes ABO/Rh)     Status: None (Preliminary result)   Collection Time: 05/15/19  8:12 PM  Result Value Ref Range   Gestational Age(Wks) 25    ABO/RH(D)      AB NEG Performed at [facility] [address], [place], [place] [address]    Antibody Screen PENDING   Urinalysis, Routine w reflex microscopic     Status: Abnormal   Collection Time: 05/15/19  8:20 PM  Result Value Ref Range   Color, Urine YELLOW YELLOW   APPearance HAZY (A) CLEAR   Specific Gravity, Urine 1.013 1.005 - 1.030   pH 7.0 5.0 - 8.0   Glucose, UA NEGATIVE  NEGATIVE mg/dL   Hgb urine dipstick NEGATIVE NEGATIVE   Bilirubin Urine NEGATIVE NEGATIVE   Ketones, ur NEGATIVE NEGATIVE mg/dL   Protein, ur NEGATIVE NEGATIVE mg/dL   Nitrite NEGATIVE NEGATIVE   Leukocytes,Ua LARGE (A) NEGATIVE   RBC / HPF 0-5 0 - 5 RBC/hpf   WBC, UA 11-20 0 - 5 WBC/hpf   Bacteria, UA RARE (A) NONE SEEN   Squamous Epithelial / LPF 11-20 0 - 5    Fetal Monitoring: Baseline: 140 bpm Variability: moderate Accelerations: 10 x 10  Decelerations: none Contractions: none, mild UI   MAU Course  Procedures None  MDM Fern - negative Amnisure collected due to prematurity to confirm  Cervix closed, thick, no bleeding noted on exam Rh negative - rhogam work-up ordered. Rhogam given due to bleeding yesterday.   Assessment and Plan  A: SIUP at [redacted]w[redacted]d Uterine irritability  Vaginal discharge in pregnancy, second trimester  Rh negative   P: Discharge home Preterm labor and bleeding precautions discussed Patient advised to increase PO hydration and rest for the next few days  Patient advised to follow-up with [facility] as scheduled on Thursday  Patient may return to MAU as needed or if her condition were to change or worsen   Kerry Hough, PA-C 05/15/2019, 8:57 PM

## 2019-05-15 NOTE — MAU Note (Signed)
Amber Pope is a 33 y.o. at [redacted]w[redacted]d here in MAU reporting:  +abdominal cramping  +vaginal bleeding. Quarter size + LOF. Clear. odor Onset of complaint: started yesterday 3pm Just arrived back from out of town ([place] and [place]) on vacation. Pain score: 6/10 Vitals:   05/15/19 1858  BP: (!) 146/83  Pulse: 96  Resp: 18  Temp: 98.3 F (36.8 C)  SpO2: 100%    FHT: +FM Lab orders placed from triage: ua

## 2019-05-16 LAB — RH IG WORKUP (INCLUDES ABO/RH)
ABO/RH(D): AB NEG
Antibody Screen: NEGATIVE
Gestational Age(Wks): 25
Unit division: 0

## 2019-05-19 DIAGNOSIS — R109 Unspecified abdominal pain: Secondary | ICD-10-CM | POA: Diagnosis not present

## 2019-05-19 DIAGNOSIS — O3440 Maternal care for other abnormalities of cervix, unspecified trimester: Secondary | ICD-10-CM | POA: Diagnosis not present

## 2019-05-19 DIAGNOSIS — Z3689 Encounter for other specified antenatal screening: Secondary | ICD-10-CM | POA: Diagnosis not present

## 2019-05-19 DIAGNOSIS — Z3A26 26 weeks gestation of pregnancy: Secondary | ICD-10-CM | POA: Diagnosis not present

## 2019-05-19 DIAGNOSIS — O26892 Other specified pregnancy related conditions, second trimester: Secondary | ICD-10-CM | POA: Diagnosis not present

## 2019-05-19 DIAGNOSIS — O3442 Maternal care for other abnormalities of cervix, second trimester: Secondary | ICD-10-CM | POA: Diagnosis not present

## 2019-05-31 DIAGNOSIS — O26892 Other specified pregnancy related conditions, second trimester: Secondary | ICD-10-CM | POA: Diagnosis not present

## 2019-05-31 DIAGNOSIS — O3442 Maternal care for other abnormalities of cervix, second trimester: Secondary | ICD-10-CM | POA: Diagnosis not present

## 2019-05-31 DIAGNOSIS — Z3A27 27 weeks gestation of pregnancy: Secondary | ICD-10-CM | POA: Diagnosis not present

## 2019-05-31 DIAGNOSIS — O26899 Other specified pregnancy related conditions, unspecified trimester: Secondary | ICD-10-CM | POA: Diagnosis not present

## 2019-06-02 DIAGNOSIS — Z3689 Encounter for other specified antenatal screening: Secondary | ICD-10-CM | POA: Diagnosis not present

## 2019-06-02 DIAGNOSIS — O26893 Other specified pregnancy related conditions, third trimester: Secondary | ICD-10-CM | POA: Diagnosis not present

## 2019-06-02 DIAGNOSIS — Z3A28 28 weeks gestation of pregnancy: Secondary | ICD-10-CM | POA: Diagnosis not present

## 2019-06-02 DIAGNOSIS — O3443 Maternal care for other abnormalities of cervix, third trimester: Secondary | ICD-10-CM | POA: Diagnosis not present

## 2019-06-09 DIAGNOSIS — O3443 Maternal care for other abnormalities of cervix, third trimester: Secondary | ICD-10-CM | POA: Diagnosis not present

## 2019-06-09 DIAGNOSIS — Z3A29 29 weeks gestation of pregnancy: Secondary | ICD-10-CM | POA: Diagnosis not present

## 2019-06-21 DIAGNOSIS — O3440 Maternal care for other abnormalities of cervix, unspecified trimester: Secondary | ICD-10-CM | POA: Diagnosis not present

## 2019-06-21 DIAGNOSIS — Z3A30 30 weeks gestation of pregnancy: Secondary | ICD-10-CM | POA: Diagnosis not present

## 2019-07-07 DIAGNOSIS — Z3A33 33 weeks gestation of pregnancy: Secondary | ICD-10-CM | POA: Diagnosis not present

## 2019-07-07 DIAGNOSIS — O3443 Maternal care for other abnormalities of cervix, third trimester: Secondary | ICD-10-CM | POA: Diagnosis not present

## 2019-07-14 DIAGNOSIS — Z3A34 34 weeks gestation of pregnancy: Secondary | ICD-10-CM | POA: Diagnosis not present

## 2019-07-21 DIAGNOSIS — Z23 Encounter for immunization: Secondary | ICD-10-CM | POA: Diagnosis not present

## 2019-07-21 DIAGNOSIS — Z3685 Encounter for antenatal screening for Streptococcus B: Secondary | ICD-10-CM | POA: Diagnosis not present

## 2019-07-21 DIAGNOSIS — O3663X0 Maternal care for excessive fetal growth, third trimester, not applicable or unspecified: Secondary | ICD-10-CM | POA: Diagnosis not present

## 2019-07-21 DIAGNOSIS — Z3A35 35 weeks gestation of pregnancy: Secondary | ICD-10-CM | POA: Diagnosis not present

## 2019-07-26 ENCOUNTER — Other Ambulatory Visit: Payer: Self-pay

## 2019-07-26 DIAGNOSIS — R6889 Other general symptoms and signs: Secondary | ICD-10-CM | POA: Diagnosis not present

## 2019-07-26 DIAGNOSIS — Z20822 Contact with and (suspected) exposure to covid-19: Secondary | ICD-10-CM

## 2019-07-28 LAB — NOVEL CORONAVIRUS, NAA: SARS-CoV-2, NAA: NOT DETECTED

## 2019-07-29 DIAGNOSIS — Z3A36 36 weeks gestation of pregnancy: Secondary | ICD-10-CM | POA: Diagnosis not present

## 2019-07-29 DIAGNOSIS — O3663X0 Maternal care for excessive fetal growth, third trimester, not applicable or unspecified: Secondary | ICD-10-CM | POA: Diagnosis not present

## 2019-08-04 DIAGNOSIS — O365930 Maternal care for other known or suspected poor fetal growth, third trimester, not applicable or unspecified: Secondary | ICD-10-CM | POA: Diagnosis not present

## 2019-08-04 DIAGNOSIS — Z3A37 37 weeks gestation of pregnancy: Secondary | ICD-10-CM | POA: Diagnosis not present

## 2019-08-08 ENCOUNTER — Encounter (HOSPITAL_COMMUNITY): Payer: Self-pay | Admitting: *Deleted

## 2019-08-08 ENCOUNTER — Telehealth (HOSPITAL_COMMUNITY): Payer: Self-pay | Admitting: *Deleted

## 2019-08-08 NOTE — Telephone Encounter (Signed)
Preadmission screen  

## 2019-08-09 ENCOUNTER — Encounter (HOSPITAL_COMMUNITY): Payer: Self-pay | Admitting: *Deleted

## 2019-08-09 ENCOUNTER — Telehealth (HOSPITAL_COMMUNITY): Payer: Self-pay | Admitting: *Deleted

## 2019-08-09 NOTE — Telephone Encounter (Signed)
Preadmission screen  

## 2019-08-12 ENCOUNTER — Other Ambulatory Visit: Payer: Self-pay | Admitting: Obstetrics and Gynecology

## 2019-08-12 DIAGNOSIS — O365930 Maternal care for other known or suspected poor fetal growth, third trimester, not applicable or unspecified: Secondary | ICD-10-CM | POA: Diagnosis not present

## 2019-08-12 DIAGNOSIS — Z3A38 38 weeks gestation of pregnancy: Secondary | ICD-10-CM | POA: Diagnosis not present

## 2019-08-15 ENCOUNTER — Other Ambulatory Visit: Payer: Self-pay

## 2019-08-15 ENCOUNTER — Other Ambulatory Visit (HOSPITAL_COMMUNITY)
Admission: RE | Admit: 2019-08-15 | Discharge: 2019-08-15 | Disposition: A | Discharge status: Home / Self Care | Payer: BC Managed Care – PPO | Source: Ambulatory Visit | Attending: Obstetrics and Gynecology | Admitting: Obstetrics and Gynecology

## 2019-08-15 ENCOUNTER — Other Ambulatory Visit: Payer: Self-pay | Admitting: Obstetrics and Gynecology

## 2019-08-15 DIAGNOSIS — Z20828 Contact with and (suspected) exposure to other viral communicable diseases: Secondary | ICD-10-CM | POA: Diagnosis not present

## 2019-08-15 DIAGNOSIS — Z01812 Encounter for preprocedural laboratory examination: Secondary | ICD-10-CM | POA: Insufficient documentation

## 2019-08-15 LAB — SARS CORONAVIRUS 2 BY RT PCR (HOSPITAL ORDER, PERFORMED IN ~~LOC~~ HOSPITAL LAB): SARS Coronavirus 2: NEGATIVE

## 2019-08-15 NOTE — MAU Note (Signed)
covid swab collected Pt tolerated well. Asymptomatic

## 2019-08-17 ENCOUNTER — Inpatient Hospital Stay (HOSPITAL_COMMUNITY)
Admission: AD | Admit: 2019-08-17 | Discharge: 2019-08-19 | DRG: 806 | Disposition: A | Discharge status: Home / Self Care | Payer: BC Managed Care – PPO | Attending: Obstetrics and Gynecology | Admitting: Obstetrics and Gynecology

## 2019-08-17 ENCOUNTER — Inpatient Hospital Stay (HOSPITAL_COMMUNITY): Payer: BC Managed Care – PPO

## 2019-08-17 ENCOUNTER — Other Ambulatory Visit: Payer: Self-pay

## 2019-08-17 ENCOUNTER — Encounter (HOSPITAL_COMMUNITY): Payer: Self-pay | Admitting: General Practice

## 2019-08-17 ENCOUNTER — Inpatient Hospital Stay (HOSPITAL_COMMUNITY): Payer: BC Managed Care – PPO | Admitting: Anesthesiology

## 2019-08-17 DIAGNOSIS — O9952 Diseases of the respiratory system complicating childbirth: Secondary | ICD-10-CM | POA: Diagnosis not present

## 2019-08-17 DIAGNOSIS — J45909 Unspecified asthma, uncomplicated: Secondary | ICD-10-CM | POA: Diagnosis not present

## 2019-08-17 DIAGNOSIS — Z6791 Unspecified blood type, Rh negative: Secondary | ICD-10-CM | POA: Diagnosis not present

## 2019-08-17 DIAGNOSIS — Z3A39 39 weeks gestation of pregnancy: Secondary | ICD-10-CM | POA: Diagnosis not present

## 2019-08-17 DIAGNOSIS — O139 Gestational [pregnancy-induced] hypertension without significant proteinuria, unspecified trimester: Secondary | ICD-10-CM | POA: Diagnosis present

## 2019-08-17 DIAGNOSIS — Z87891 Personal history of nicotine dependence: Secondary | ICD-10-CM | POA: Diagnosis not present

## 2019-08-17 DIAGNOSIS — O134 Gestational [pregnancy-induced] hypertension without significant proteinuria, complicating childbirth: Secondary | ICD-10-CM | POA: Diagnosis not present

## 2019-08-17 DIAGNOSIS — O26893 Other specified pregnancy related conditions, third trimester: Secondary | ICD-10-CM | POA: Diagnosis not present

## 2019-08-17 DIAGNOSIS — O9081 Anemia of the puerperium: Secondary | ICD-10-CM | POA: Diagnosis not present

## 2019-08-17 DIAGNOSIS — K219 Gastro-esophageal reflux disease without esophagitis: Secondary | ICD-10-CM | POA: Diagnosis not present

## 2019-08-17 DIAGNOSIS — G9050 Complex regional pain syndrome I, unspecified: Secondary | ICD-10-CM | POA: Diagnosis present

## 2019-08-17 DIAGNOSIS — K5909 Other constipation: Secondary | ICD-10-CM | POA: Diagnosis present

## 2019-08-17 DIAGNOSIS — O99824 Streptococcus B carrier state complicating childbirth: Secondary | ICD-10-CM | POA: Diagnosis present

## 2019-08-17 DIAGNOSIS — O9962 Diseases of the digestive system complicating childbirth: Secondary | ICD-10-CM | POA: Diagnosis not present

## 2019-08-17 DIAGNOSIS — D62 Acute posthemorrhagic anemia: Secondary | ICD-10-CM | POA: Diagnosis not present

## 2019-08-17 DIAGNOSIS — Z3490 Encounter for supervision of normal pregnancy, unspecified, unspecified trimester: Secondary | ICD-10-CM | POA: Diagnosis present

## 2019-08-17 LAB — COMPREHENSIVE METABOLIC PANEL
ALT: 7 U/L (ref 0–44)
AST: 16 U/L (ref 15–41)
Albumin: 2.6 g/dL — ABNORMAL LOW (ref 3.5–5.0)
Alkaline Phosphatase: 154 U/L — ABNORMAL HIGH (ref 38–126)
Anion gap: 10 (ref 5–15)
BUN: 6 mg/dL (ref 6–20)
CO2: 18 mmol/L — ABNORMAL LOW (ref 22–32)
Calcium: 8.5 mg/dL — ABNORMAL LOW (ref 8.9–10.3)
Chloride: 107 mmol/L (ref 98–111)
Creatinine, Ser: 0.73 mg/dL (ref 0.44–1.00)
GFR calc Af Amer: >60 mL/min (ref >60)
GFR calc non Af Amer: >60 mL/min (ref >60)
Glucose, Bld: 132 mg/dL — ABNORMAL HIGH (ref 70–99)
Potassium: 3.4 mmol/L — ABNORMAL LOW (ref 3.5–5.1)
Sodium: 135 mmol/L (ref 135–145)
Total Bilirubin: 0.3 mg/dL (ref 0.3–1.2)
Total Protein: 6.5 g/dL (ref 6.5–8.1)

## 2019-08-17 LAB — CBC
HCT: 33.9 % — ABNORMAL LOW (ref 36.0–46.0)
Hemoglobin: 11 g/dL — ABNORMAL LOW (ref 12.0–15.0)
MCH: 27.6 pg (ref 26.0–34.0)
MCHC: 32.4 g/dL (ref 30.0–36.0)
MCV: 85 fL (ref 80.0–100.0)
Platelets: 280 10*3/uL (ref 150–400)
RBC: 3.99 MIL/uL (ref 3.87–5.11)
RDW: 13.5 % (ref 11.5–15.5)
WBC: 9.7 10*3/uL (ref 4.0–10.5)
nRBC: 0 % (ref 0.0–0.2)

## 2019-08-17 MED ORDER — EPHEDRINE 5 MG/ML INJ: 10.0000 mg | INTRAVENOUS | Status: DC | PRN

## 2019-08-17 MED ORDER — DIPHENHYDRAMINE HCL 25 MG PO CAPS: 25.0000 mg | ORAL_CAPSULE | Freq: Four times a day (QID) | ORAL | Status: DC | PRN

## 2019-08-17 MED ORDER — TERBUTALINE SULFATE 1 MG/ML IJ SOLN: 0.2500 mg | Freq: Once | INTRAMUSCULAR | Status: DC | PRN

## 2019-08-17 MED ORDER — PHENYLEPHRINE 40 MCG/ML (10ML) SYRINGE FOR IV PUSH (FOR BLOOD PRESSURE SUPPORT): 80.0000 ug | PREFILLED_SYRINGE | INTRAVENOUS | Status: DC | PRN

## 2019-08-17 MED ORDER — ACETAMINOPHEN 325 MG PO TABS: 650.0000 mg | ORAL_TABLET | ORAL | Status: DC | PRN

## 2019-08-17 MED ORDER — OXYCODONE-ACETAMINOPHEN 5-325 MG PO TABS
1.0000 | ORAL_TABLET | ORAL | Status: DC | PRN
  Administered 2019-08-17 – 2019-08-19 (×4): 1 via ORAL
  Filled 2019-08-17 (×3): qty 1

## 2019-08-17 MED ORDER — BENZOCAINE-MENTHOL 20-0.5 % EX AERO
1.0000 "application " | INHALATION_SPRAY | CUTANEOUS | Status: DC | PRN
  Administered 2019-08-17: 1 via TOPICAL
  Filled 2019-08-17: qty 56

## 2019-08-17 MED ORDER — OXYTOCIN 40 UNITS IN NORMAL SALINE INFUSION - SIMPLE MED
1.0000 m[IU]/min | INTRAVENOUS | Status: DC
  Administered 2019-08-17: 2 m[IU]/min via INTRAVENOUS
  Filled 2019-08-17: qty 1000

## 2019-08-17 MED ORDER — PRENATAL MULTIVITAMIN CH
1.0000 | ORAL_TABLET | Freq: Every day | ORAL | Status: DC
  Filled 2019-08-17 (×2): qty 1

## 2019-08-17 MED ORDER — FENTANYL-BUPIVACAINE-NACL 0.5-0.125-0.9 MG/250ML-% EP SOLN
12.0000 mL/h | EPIDURAL | Status: DC | PRN
  Filled 2019-08-17: qty 250

## 2019-08-17 MED ORDER — LACTATED RINGERS IV SOLN: 500.0000 mL | Freq: Once | INTRAVENOUS | Status: DC

## 2019-08-17 MED ORDER — ONDANSETRON HCL 4 MG/2ML IJ SOLN
4.0000 mg | Freq: Four times a day (QID) | INTRAMUSCULAR | Status: DC | PRN
  Administered 2019-08-17: 18:00:00 4 mg via INTRAVENOUS
  Filled 2019-08-17: qty 2

## 2019-08-17 MED ORDER — TETANUS-DIPHTH-ACELL PERTUSSIS 5-2.5-18.5 LF-MCG/0.5 IM SUSP: 0.5000 mL | Freq: Once | INTRAMUSCULAR | Status: DC

## 2019-08-17 MED ORDER — PROMETHAZINE HCL 25 MG/ML IJ SOLN
12.5000 mg | Freq: Once | INTRAMUSCULAR | Status: AC
  Administered 2019-08-17: 12.5 mg via INTRAVENOUS
  Filled 2019-08-17: qty 1

## 2019-08-17 MED ORDER — ACETAMINOPHEN 325 MG PO TABS
650.0000 mg | ORAL_TABLET | ORAL | Status: DC | PRN
  Administered 2019-08-18: 650 mg via ORAL
  Filled 2019-08-17 (×2): qty 2

## 2019-08-17 MED ORDER — ONDANSETRON HCL 4 MG PO TABS
4.0000 mg | ORAL_TABLET | ORAL | Status: DC | PRN
  Administered 2019-08-17 – 2019-08-18 (×5): 4 mg via ORAL
  Filled 2019-08-17 (×5): qty 1

## 2019-08-17 MED ORDER — OXYTOCIN BOLUS FROM INFUSION
500.0000 mL | Freq: Once | INTRAVENOUS | Status: AC
  Administered 2019-08-17: 500 mL via INTRAVENOUS

## 2019-08-17 MED ORDER — SODIUM CHLORIDE 0.9 % IV SOLN
5.0000 10*6.[IU] | Freq: Once | INTRAVENOUS | Status: AC
  Administered 2019-08-17: 5 10*6.[IU] via INTRAVENOUS
  Filled 2019-08-17: qty 5

## 2019-08-17 MED ORDER — DIBUCAINE (PERIANAL) 1 % EX OINT: 1.0000 "application " | TOPICAL_OINTMENT | CUTANEOUS | Status: DC | PRN

## 2019-08-17 MED ORDER — OXYTOCIN 40 UNITS IN NORMAL SALINE INFUSION - SIMPLE MED: 2.5000 [IU]/h | INTRAVENOUS | Status: DC

## 2019-08-17 MED ORDER — DIPHENHYDRAMINE HCL 50 MG/ML IJ SOLN: 12.5000 mg | INTRAMUSCULAR | Status: DC | PRN

## 2019-08-17 MED ORDER — PENICILLIN G 3 MILLION UNITS IVPB - SIMPLE MED: 3.0000 10*6.[IU] | INTRAVENOUS | Status: DC

## 2019-08-17 MED ORDER — LIDOCAINE HCL (PF) 1 % IJ SOLN
INTRAMUSCULAR | Status: DC | PRN
  Administered 2019-08-17: 8 mL via EPIDURAL
  Administered 2019-08-17: 4 mL via EPIDURAL

## 2019-08-17 MED ORDER — IBUPROFEN 600 MG PO TABS
600.0000 mg | ORAL_TABLET | Freq: Four times a day (QID) | ORAL | Status: DC
  Administered 2019-08-17 – 2019-08-19 (×6): 600 mg via ORAL
  Filled 2019-08-17 (×6): qty 1

## 2019-08-17 MED ORDER — SOD CITRATE-CITRIC ACID 500-334 MG/5ML PO SOLN: 30.0000 mL | ORAL | Status: DC | PRN

## 2019-08-17 MED ORDER — WITCH HAZEL-GLYCERIN EX PADS: 1.0000 "application " | MEDICATED_PAD | CUTANEOUS | Status: DC | PRN

## 2019-08-17 MED ORDER — COCONUT OIL OIL: 1.0000 "application " | TOPICAL_OIL | Status: DC | PRN

## 2019-08-17 MED ORDER — METHYLERGONOVINE MALEATE 0.2 MG PO TABS: 0.2000 mg | ORAL_TABLET | ORAL | Status: DC | PRN

## 2019-08-17 MED ORDER — BUTORPHANOL TARTRATE 1 MG/ML IJ SOLN
1.0000 mg | Freq: Once | INTRAMUSCULAR | Status: AC
  Administered 2019-08-17: 1 mg via INTRAVENOUS
  Filled 2019-08-17: qty 1

## 2019-08-17 MED ORDER — LIDOCAINE HCL (PF) 1 % IJ SOLN: 30.0000 mL | INTRAMUSCULAR | Status: DC | PRN

## 2019-08-17 MED ORDER — METHYLERGONOVINE MALEATE 0.2 MG/ML IJ SOLN: 0.2000 mg | INTRAMUSCULAR | Status: DC | PRN

## 2019-08-17 MED ORDER — ONDANSETRON HCL 4 MG/2ML IJ SOLN: 4.0000 mg | INTRAMUSCULAR | Status: DC | PRN

## 2019-08-17 MED ORDER — SIMETHICONE 80 MG PO CHEW: 80.0000 mg | CHEWABLE_TABLET | ORAL | Status: DC | PRN

## 2019-08-17 MED ORDER — LACTATED RINGERS IV SOLN
INTRAVENOUS | Status: DC
  Administered 2019-08-17 (×2): via INTRAVENOUS

## 2019-08-17 MED ORDER — SENNOSIDES-DOCUSATE SODIUM 8.6-50 MG PO TABS
2.0000 | ORAL_TABLET | ORAL | Status: DC
  Administered 2019-08-17 – 2019-08-19 (×2): 2 via ORAL
  Filled 2019-08-17 (×2): qty 2

## 2019-08-17 MED ORDER — ZOLPIDEM TARTRATE 5 MG PO TABS: 5.0000 mg | ORAL_TABLET | Freq: Every evening | ORAL | Status: DC | PRN

## 2019-08-17 MED ORDER — SODIUM CHLORIDE (PF) 0.9 % IJ SOLN
INTRAMUSCULAR | Status: DC | PRN
  Administered 2019-08-17: 12 mL/h via EPIDURAL

## 2019-08-17 MED ORDER — LACTATED RINGERS IV SOLN: 500.0000 mL | INTRAVENOUS | Status: DC | PRN

## 2019-08-17 MED ORDER — OXYCODONE-ACETAMINOPHEN 5-325 MG PO TABS
2.0000 | ORAL_TABLET | ORAL | Status: DC | PRN
  Administered 2019-08-18 (×4): 2 via ORAL
  Filled 2019-08-17 (×5): qty 2

## 2019-08-17 NOTE — H&P (Signed)
Amber Pope is a 33 y.o. female presenting for IOL for Gestational HTN and history of complex regional pain syndrome with pain management concerns.. OB History    Gravida  2   Para  1   Term  1   Preterm      AB      Living  1     SAB      TAB      Ectopic      Multiple  0   Live Births  1          Past Medical History:  Diagnosis Date  . Allergy   . Anxiety   . Asthma   . Complication of anesthesia    tends to experience "excessive hysteria" post op  . Constipation   . Constipation   . CRPS (complex regional pain syndrome type I)    [place], Dr. Fredric Mare  . Depression   . Family history of cancer    esophageal, father  . Gastric ulcer   . GERD (gastroesophageal reflux disease)   . GERD (gastroesophageal reflux disease)   . Panic disorder   . Renal calculus   . Wears glasses    Past Surgical History:  Procedure Laterality Date  . ABLATION ON ENDOMETRIOSIS N/A 01/06/2013   Procedure: Robotic-Assisted Resection of Endometriosis;  Surgeon: Lovenia Kim, MD;  Location: [facility];  Service: Gynecology;  Laterality: N/A;  . ESOPHAGOGASTRODUODENOSCOPY     2 prior, last in 2016, most recent eval with [facility]  . kidney stent placement     with later removal  . LITHOTRIPSY     x 2  . ROBOTIC ASSISTED LAPAROSCOPIC LYSIS OF ADHESION N/A 01/06/2013   Procedure: Robotic-Assisted Laparoscopic Lysis of Adhesions;  Surgeon: Lovenia Kim, MD;  Location: [facility];  Service: Gynecology;  Laterality: N/A;  . TENDON RECONSTRUCTION  10/2015   right foot   Family History: family history includes Barrett's esophagus in her father; COPD in her maternal grandfather and maternal grandmother; Dementia in her maternal grandmother; Depression in her mother; Diabetes in her maternal grandfather and paternal grandfather; Esophageal cancer in her father; GER disease in her brother, father, mother, and son; Hyperlipidemia in her mother; Hypertension in her  father; Other in her son; Sleep apnea in her brother; Ulcers in her brother. Social History:  reports that she quit smoking about 3 years ago. She has never used smokeless tobacco. She reports previous drug use. Drug: Marijuana. She reports that she does not drink alcohol.     Maternal Diabetes: No Genetic Screening: Normal Maternal Ultrasounds/Referrals: Normal Fetal Ultrasounds or other Referrals:  None Maternal Substance Abuse:  No Significant Maternal Medications:  None Significant Maternal Lab Results:  Group B Strep positive Other Comments:  None  Review of Systems  Constitutional: Negative.   All other systems reviewed and are negative.  Maternal Medical History:  Reason for admission: Contractions.   Contractions: Frequency: rare.   Perceived severity is mild.    Fetal activity: Perceived fetal activity is normal.   Last perceived fetal movement was within the past hour.    Prenatal complications: no prenatal complications Prenatal Complications - Diabetes: none.    Dilation: 3 Effacement (%): 80 Station: 0 Exam by:: Dr. Ronita Hipps  Blood pressure 121/79, pulse 97, resp. rate 18, height 5\' 3"  (1.6 m), weight 85.3 kg, currently breastfeeding. Maternal Exam:  Uterine Assessment: Contraction strength is mild.  Contraction frequency is irregular.   Abdomen: Patient  reports no abdominal tenderness. Fetal presentation: vertex  Introitus: Normal vulva. Normal vagina.  Ferning test: not done.  Nitrazine test: not done. Amniotic fluid character: not assessed.  Pelvis: adequate for delivery.   Cervix: Cervix evaluated by digital exam.     Physical Exam  Nursing note and vitals reviewed. Constitutional: She is oriented to person, place, and time. She appears well-developed and well-nourished.  HENT:  Head: Normocephalic and atraumatic.  Neck: Normal range of motion. Neck supple.  Cardiovascular: Normal rate and regular rhythm.  Respiratory: Effort normal and breath  sounds normal.  GI: Soft. Bowel sounds are normal.  Genitourinary:    Vulva, vagina and uterus normal.   Musculoskeletal: Normal range of motion.  Neurological: She is alert and oriented to person, place, and time.  Skin: Skin is warm and dry.  Psychiatric: She has a normal mood and affect.    Prenatal labs: ABO, Rh: --/--/AB NEG (10/07 1216) Antibody: POS (10/07 1216) Rubella: Immune (03/17 0000) RPR: Nonreactive (03/17 0000)  HBsAg: Negative (03/17 0000)  HIV: Non-reactive (03/17 0000)  GBS:     Assessment/Plan: 39wks IUP Gestational HTN GBS pos Admit Pitocin IV abx   Amber Pope J 08/17/2019, 2:10 PM

## 2019-08-17 NOTE — Anesthesia Preprocedure Evaluation (Signed)
Anesthesia Evaluation  Patient identified by MRN, date of birth, ID band Patient awake    Reviewed: Allergy & Precautions, H&P , NPO status , Patient's Chart, lab work & pertinent test results, reviewed documented beta blocker date and time   History of Anesthesia Complications (+) history of anesthetic complications  Airway Mallampati: II  TM Distance: >3 FB Neck ROM: full    Dental no notable dental hx. (+) Teeth Intact   Pulmonary neg pulmonary ROS, asthma , former smoker,    Pulmonary exam normal breath sounds clear to auscultation       Cardiovascular Exercise Tolerance: Good negative cardio ROS Normal cardiovascular exam Rhythm:regular Rate:Normal     Neuro/Psych PSYCHIATRIC DISORDERS Anxiety Depression negative neurological ROS     GI/Hepatic Neg liver ROS, PUD, GERD  Medicated,  Endo/Other  negative endocrine ROS  Renal/GU Renal diseasenegative Renal ROS     Musculoskeletal   Abdominal (+) + obese,   Peds  Hematology negative hematology ROS (+)   Anesthesia Other Findings GERD (gastroesophageal reflux disease)     Anxiety        Asthma     Complication of anesthesia   tends to experience "excessive hysteria" post op    Renal calculus     Depression        Panic disorder    Reproductive/Obstetrics negative OB ROS (+) Pregnancy                             Anesthesia Physical  Anesthesia Plan  ASA: II  Anesthesia Plan: Epidural   Post-op Pain Management:    Induction:   PONV Risk Score and Plan:   Airway Management Planned:   Additional Equipment:   Intra-op Plan:   Post-operative Plan:   Informed Consent: I have reviewed the patients History and Physical, chart, labs and discussed the procedure including the risks, benefits and alternatives for the proposed anesthesia with the patient or authorized representative who has indicated his/her understanding and  acceptance.       Plan Discussed with:   Anesthesia Plan Comments:         Anesthesia Quick Evaluation

## 2019-08-17 NOTE — Anesthesia Procedure Notes (Signed)
Epidural Patient location during procedure: OB Start time: 08/17/2019 1:55 PM End time: 08/17/2019 1:59 PM  Staffing Anesthesiologist: Lyn Hollingshead, MD Performed: anesthesiologist   Preanesthetic Checklist Completed: patient identified, site marked, surgical consent, pre-op evaluation, timeout performed, IV checked, risks and benefits discussed and monitors and equipment checked  Epidural Patient position: sitting Prep: site prepped and draped and DuraPrep Patient monitoring: continuous pulse ox and blood pressure Approach: midline Location: L3-L4 Injection technique: LOR air  Needle:  Needle type: Tuohy  Needle gauge: 17 G Needle length: 9 cm and 9 Needle insertion depth: 6 cm Catheter type: closed end flexible Catheter size: 19 Gauge Catheter at skin depth: 11 cm Test dose: negative and Other  Assessment Events: blood not aspirated, injection not painful, no injection resistance, negative IV test and no paresthesia

## 2019-08-18 LAB — CBC
HCT: 28.8 % — ABNORMAL LOW (ref 36.0–46.0)
Hemoglobin: 9.5 g/dL — ABNORMAL LOW (ref 12.0–15.0)
MCH: 28 pg (ref 26.0–34.0)
MCHC: 33 g/dL (ref 30.0–36.0)
MCV: 85 fL (ref 80.0–100.0)
Platelets: 201 10*3/uL (ref 150–400)
RBC: 3.39 MIL/uL — ABNORMAL LOW (ref 3.87–5.11)
RDW: 13.7 % (ref 11.5–15.5)
WBC: 9.8 10*3/uL (ref 4.0–10.5)
nRBC: 0 % (ref 0.0–0.2)

## 2019-08-18 LAB — RPR: RPR Ser Ql: NONREACTIVE

## 2019-08-18 MED ORDER — ALUM HYDROXIDE-MAG CARBONATE 95-358 MG/15ML PO SUSP
15.0000 mL | ORAL | Status: DC | PRN
  Filled 2019-08-18: qty 15

## 2019-08-18 MED ORDER — FLEET ENEMA 7-19 GM/118ML RE ENEM
1.0000 | ENEMA | Freq: Every day | RECTAL | Status: DC | PRN
  Administered 2019-08-18: 1 via RECTAL
  Filled 2019-08-18: qty 1

## 2019-08-18 MED ORDER — PANTOPRAZOLE SODIUM 40 MG PO TBEC
40.0000 mg | DELAYED_RELEASE_TABLET | Freq: Every day | ORAL | Status: DC
  Administered 2019-08-18 – 2019-08-19 (×2): 40 mg via ORAL
  Filled 2019-08-18 (×2): qty 1

## 2019-08-18 MED ORDER — PROMETHAZINE HCL 25 MG PO TABS
12.5000 mg | ORAL_TABLET | ORAL | Status: DC | PRN
  Administered 2019-08-18 – 2019-08-19 (×2): 12.5 mg via ORAL
  Filled 2019-08-18 (×2): qty 1

## 2019-08-18 MED ORDER — FAMOTIDINE 20 MG PO TABS
20.0000 mg | ORAL_TABLET | Freq: Every day | ORAL | Status: DC
  Administered 2019-08-18: 20 mg via ORAL
  Filled 2019-08-18 (×2): qty 1

## 2019-08-18 MED ORDER — ALUM & MAG HYDROXIDE-SIMETH 200-200-20 MG/5ML PO SUSP
15.0000 mL | ORAL | Status: DC | PRN
  Filled 2019-08-18: qty 30

## 2019-08-18 MED ORDER — ALUM HYDROXIDE-MAG CARBONATE 95-358 MG/15ML PO SUSP
15.0000 mL | ORAL | Status: DC | PRN
  Filled 2019-08-18 (×2): qty 15

## 2019-08-18 NOTE — Progress Notes (Addendum)
PPD #1, SVD, 2nd degree perineal repair, baby girl   S:  Reports feeling okay, reports yesterday was miserable with her chronic pain and postpartum cramps.   Denies HA, visual changes, RUQ/epigastric pain              Tolerating po/ No nausea or vomiting / Denies dizziness or SOB             Bleeding is light             Pain controlled with Motrin, Tylenol, and Percocet             Up ad lib / ambulatory / voiding QS  Newborn breast feeding - going well, but worsening cramping with feeds  O:               VS: BP 128/84 (BP Location: Left Arm)   Pulse 78   Temp 98.3 F (36.8 C) (Oral)   Resp 18   Ht 5\' 3"  (1.6 m)   Wt 85.3 kg   LMP  (LMP Unknown)   SpO2 100%   Breastfeeding Unknown   BMI 33.30 kg/m  08/18/19 1351  98.2 F (36.8 C)  87  -  16  99/61  Semi-fowlers  99 %  -  - BH   08/18/19 0533  98.3 F (36.8 C)  78  -  18  128/84  Semi-fowlers  100 %  -  - TR   08/17/19 2349  98.2 F (36.8 C)  87  -  18  125/80  Semi-fowlers  99 %  -  - TR   08/17/19 1930  98 F (36.7 C)  92  -  18  138/87  Semi-fowlers  100 %  -  - TR   08/17/19 1825  98 F (36.7 C)  80  -  17  121/90  -  100 %  -  - HP      LABS:              Recent Labs    08/17/19 1217 08/18/19 0618  WBC 9.7 9.8  HGB 11.0* 9.5*  PLT 280 201               Blood type: --/--/AB NEG (10/07 1216)  Rubella: Immune (03/17 0000)                     I&O: Intake/Output      10/07 0701 - 10/08 0700 10/08 0701 - 10/09 0700   Urine (mL/kg/hr) 900    Blood 250    Total Output 1150    Net -1150                       Physical Exam:             Alert and oriented X3  Lungs: Clear and unlabored  Heart: regular rate and rhythm / no murmurs  Abdomen: soft, non-tender, non-distended              Fundus: firm, non-tender, U-3  Perineum: well approximated 2nd degree perineal, mild edema, no erythema, or ecchymosis  Lochia: small rubra on pad   Extremities: no edema, no calf pain or tenderness    A/P: PPD # 1, SVD  2nd  degree repair  ABL Anemia - stable asymptomatic   - Will defer iron to discharge given chronic constipation and current narcotic use   RH Negative - baby A Negative -  no rhogam indicated  Gestational HTN    - no meds   - No neural s/s, no evidence of PEC   Chronic constipation   - Requested Fleets enema PRN  GERD   - Requested to restart Protonix, Pepcid, and Gaviscon   CRPS   - Currently managed with Percocet   Doing well - stable status  Routine post partum orders  Continue working with lactation   Anticipate d/c home tomorrow   Lars Pinks, MSN, CNM [facility]

## 2019-08-18 NOTE — Progress Notes (Signed)
[name] was referred for history of depression/anxiety. * Referral screened out by Clinical Social Worker because none of the following criteria appear to apply: ~ History of anxiety/depression during this pregnancy, or of post-partum depression following prior delivery. ~ Diagnosis of anxiety and/or depression within last 3 years. Per [name]'s chart review, [name] diagnosed with depression and anxiety in 2012.  OR * [name]'s symptoms currently being treated with medication and/or therapy.   CSW aware that [name] scored 5 on [place] with no concerns to CSW.     Amber Pope, MSW, LCSW [facility] [phone]

## 2019-08-18 NOTE — Anesthesia Postprocedure Evaluation (Signed)
Anesthesia Post Note  Patient: Amber Pope  Procedure(s) Performed: AN AD HOC LABOR EPIDURAL     Patient location during evaluation: Mother Baby Anesthesia Type: Epidural Level of consciousness: awake Pain management: satisfactory to patient Vital Signs Assessment: post-procedure vital signs reviewed and stable Respiratory status: spontaneous breathing Cardiovascular status: stable Anesthetic complications: no    Last Vitals:  Vitals:   08/17/19 2349 08/18/19 0533  BP: 125/80 128/84  Pulse: 87 78  Resp: 18 18  Temp: 36.8 C 36.8 C  SpO2: 99% 100%    Last Pain:  Vitals:   08/18/19 0541  TempSrc:   PainSc: 8    Pain Goal: Patients Stated Pain Goal: 3 (08/18/19 0541)                 Casimer Lanius

## 2019-08-18 NOTE — Lactation Note (Signed)
This note was copied from a baby's chart. Lactation Consultation Note Baby 55 hrs old. Mom states BF great. No problem. Mom states she is very tired. Mom request hand pump for pumping and bottle feeding some. Mom states she wants to pump now and give colostrum in a bottle that she will be returning to work and has a toddler feels that pumping and bottle feeding will be a good thing sometimes. Gave mom hand pump, showed her how it works. Mom excited about hand pump. Mom asked about a pacifier. Discouraged for 2 weeks d/t hiding feeding cues. Mom states she has a forceful let down and she feels pumping will be a good thing for her. Mom asked if there was a time when was good to start. Encouraged mom to cont. BF for 2 weeks before giving bottles if possible until her milk was established.  Mom stated she was in a lot of pain and was really tired she thinks after she rest and pain settles down she will pump and let her husband give the baby BM in a bottle. Encouraged mom to hold baby STS, I&O, rest, and discussed milk storage. Encouraged mom to call for assistance or questions. Lactation brochure given.   Patient Name: Girl Estephani Mewborn [id number] Date: 08/18/2019 Reason for consult: Initial assessment;Term   Maternal Data Has patient been taught Hand Expression?: Yes Does the patient have breastfeeding experience prior to this delivery?: Yes  Feeding Feeding Type: Breast Fed  LATCH Score       Type of Nipple: Everted at rest and after stimulation  Comfort (Breast/Nipple): Soft / non-tender        Interventions Interventions: Breast feeding basics reviewed;Support pillows;Position options;Breast massage;Hand express;Breast compression;Hand pump  Lactation Tools Discussed/Used Tools: Pump Breast pump type: Manual WIC Program: No Pump Review: Setup, frequency, and cleaning;Milk Storage Initiated by:: Allayne Stack RN IBCLC Date initiated:: 08/18/19   Consult Status Consult  Status: Follow-up Date: 08/19/19 Follow-up type: In-patient    Margherita Collyer, Elta Gu[place] 08/18/2019, 2:24 AM

## 2019-08-19 MED ORDER — PROMETHAZINE HCL 25 MG PO TABS
12.5000 mg | ORAL_TABLET | Freq: Four times a day (QID) | ORAL | Status: DC | PRN
  Administered 2019-08-19 (×2): 12.5 mg via ORAL
  Filled 2019-08-19 (×2): qty 1

## 2019-08-19 MED ORDER — OXYCODONE-ACETAMINOPHEN 5-325 MG PO TABS: 1.0000 | ORAL_TABLET | Freq: Four times a day (QID) | ORAL | 0 refills | Status: AC | PRN

## 2019-08-19 MED ORDER — MAGNESIUM OXIDE 400 (241.3 MG) MG PO TABS: 400.0000 mg | ORAL_TABLET | Freq: Every day | ORAL | 4 refills | Status: DC

## 2019-08-19 MED ORDER — INFLUENZA VAC SPLIT QUAD 0.5 ML IM SUSY: 0.5000 mL | PREFILLED_SYRINGE | INTRAMUSCULAR | Status: DC

## 2019-08-19 MED ORDER — IBUPROFEN 800 MG PO TABS
800.0000 mg | ORAL_TABLET | Freq: Four times a day (QID) | ORAL | Status: DC
  Administered 2019-08-19: 800 mg via ORAL
  Filled 2019-08-19: qty 1

## 2019-08-19 MED ORDER — IBUPROFEN 800 MG PO TABS: 800.0000 mg | ORAL_TABLET | Freq: Four times a day (QID) | ORAL | 0 refills | Status: DC

## 2019-08-19 MED ORDER — OXYCODONE-ACETAMINOPHEN 5-325 MG PO TABS
1.0000 | ORAL_TABLET | Freq: Four times a day (QID) | ORAL | Status: DC | PRN
  Administered 2019-08-19: 1 via ORAL
  Filled 2019-08-19: qty 1

## 2019-08-19 MED ORDER — INFLUENZA VAC SPLIT QUAD 0.5 ML IM SUSY: 0.5000 mL | PREFILLED_SYRINGE | INTRAMUSCULAR | Status: DC | PRN

## 2019-08-19 MED ORDER — MAGNESIUM OXIDE 400 (241.3 MG) MG PO TABS
400.0000 mg | ORAL_TABLET | Freq: Every day | ORAL | Status: DC
  Administered 2019-08-19: 400 mg via ORAL
  Filled 2019-08-19: qty 1

## 2019-08-19 MED ORDER — OXYCODONE-ACETAMINOPHEN 5-325 MG PO TABS
1.0000 | ORAL_TABLET | ORAL | Status: DC | PRN
  Administered 2019-08-19: 1 via ORAL
  Filled 2019-08-19: qty 1

## 2019-08-19 NOTE — Progress Notes (Signed)
PPD 2 SVD with 2nd degree repair  Subjective:   reports feeling "lots of pain because pain medicine overdue since 0800" Not ate breakfast as waiting for analgesia and phenergan prior to breakfast vaginal bleeding reported as light pain marginally controlled  up ad lib /voiding QS without urinary concerns not suppose to be discharged before 4pm today (?)  Newborn Breastfeeding with S&S formula supplementation   Objective:   VS: BP 134/80 (BP Location: Right Arm)   Pulse 90   Temp 97.6 F (36.4 C) (Axillary)   Resp 16   Ht 5\' 3"  (1.6 m)   Wt 85.3 kg   LMP  (LMP Unknown)   SpO2 99%   Breastfeeding Unknown   BMI 33.30 kg/m   BP: 134/80 - 138/95   LABS:  Recent Labs    08/17/19 1217 08/18/19 0618  WBC 9.7 9.8  HGB 11.0* 9.5*  PLT 280 201   Blood type: --/--/AB NEG (10/07 1216) / newborn RH negative Rubella: Immune (03/17 0000)       tdap current - no flu vaccine 2020            Physical Exam: alert and oriented X3 without any distress or pain abdomen soft, non-tender, non-distended  uterine fundus firm, non-tender, Ueven perineum no edema lochia light extremities: no edema, no calf pain or tenderness   Assessment / Plan: PPD # 2 SVD with 2nd degree repair Gestational hypertension Complex Pain Syndrome - type 1 ABL anemia Chronic constipation   doing well - stable status routine post partum orders - declines DC prior to 4pm today   Artelia Laroche CNM, MSN, [facility] 08/19/2019, 7:52 AM

## 2019-08-19 NOTE — Lactation Note (Signed)
This note was copied from a baby's chart. Lactation Consultation Note  Patient Name: Amber Pope [id number] Date: 08/19/2019 Reason for consult: Follow-up assessment;Term;Nipple pain/trauma  LC in to visit with P2 Mom of term baby on day of discharge.  Baby is 36 hrs old and at 7% weight loss with good output.    Mom had baby latched on right breast while she was hand pumping the left breast.  Baby resting in her lap and latch wasn't as deep as she could be.   Suggested Mom not pump while baby is latched, but support her breast well and support baby on pillow.   Mom has compressible breasts with large nipples.  Talked about baby not latching just to nipple, that baby needs to be deeper on breast.  Baby can latch deeply, but Mom is bouncing around and talking a lot.    Mom has a DEBP set up in room.  Encouraged Mom to keep baby STS and offer breast with cues.  Mom to use coconut oil on nipples for soreness, to ask her RN.  Mom aware of OP lactation support available to her.   Encouraged Mom to call prn.  Interventions Interventions: Breast feeding basics reviewed;Assisted with latch;Skin to skin;Breast massage;Hand express;Adjust position;Support pillows;Expressed milk;Coconut oil  Lactation Tools Discussed/Used Tools: Pump;Coconut oil Breast pump type: Double-Electric Breast Pump   Consult Status Consult Status: Complete Date: 08/19/19 Follow-up type: Call as needed    [name] 08/19/2019, 12:38 PM

## 2019-08-19 NOTE — Discharge Summary (Signed)
Obstetric Discharge Summary Reason for Admission: induction of labor - gestational hypertension / Complex Pain Syndrome Prenatal Procedures: none Intrapartum Procedures: spontaneous vaginal delivery and epidural Postpartum Procedures: none Complications-Operative and Postpartum: 2nd degree perineal laceration Hemoglobin  Date Value Ref Range Status  08/18/2019 9.5 (L) 12.0 - 15.0 g/dL Final   HCT  Date Value Ref Range Status  08/18/2019 28.8 (L) 36.0 - 46.0 % Final    Physical Exam:  General: alert, cooperative and no distress Lochia: appropriate Uterine Fundus: firm Incision: healing well DVT Evaluation: No evidence of DVT seen on physical exam.  Discharge Diagnoses: Term Pregnancy-delivered and Complex Pain Syndrome / Chronic constipation / Mild Anemia  Discharge Information: Date: 08/19/2019 Activity: pelvic rest Diet: routine Medications: PNV, Ibuprofen and Oxycodone (#12) and Mag oxide 400mg  Condition: stable Instructions: refer to practice specific booklet Discharge to: home Follow-up Information    Brien Few, MD. Schedule an appointment as soon as possible for a visit in 6 week(s).   Specialty: Obstetrics and Gynecology Contact information: [address] [place] [address] [phone]           Newborn Data: Live born female  Birth Weight: 6 lb 14.4 oz (3130 g) APGAR: 75, 9  Newborn Delivery   Birth date/time: 08/17/2019 16:17:00 Delivery type: Vaginal, Spontaneous      Home with mother.  Amber Pope 08/19/2019, 10:49 AM

## 2019-08-20 LAB — TYPE AND SCREEN
ABO/RH(D): AB NEG
Antibody Screen: POSITIVE
Unit division: 0
Unit division: 0

## 2019-08-20 LAB — BPAM RBC
Blood Product Expiration Date: 202011062359
Blood Product Expiration Date: 202011082359
Unit Type and Rh: 600
Unit Type and Rh: 600

## 2019-09-16 DIAGNOSIS — F430 Acute stress reaction: Secondary | ICD-10-CM | POA: Diagnosis not present

## 2019-09-16 DIAGNOSIS — F410 Panic disorder [episodic paroxysmal anxiety] without agoraphobia: Secondary | ICD-10-CM | POA: Diagnosis not present

## 2019-09-20 ENCOUNTER — Telehealth: Payer: Self-pay | Admitting: General Practice

## 2019-09-20 DIAGNOSIS — Z0130 Encounter for examination of blood pressure without abnormal findings: Secondary | ICD-10-CM | POA: Diagnosis not present

## 2019-09-20 DIAGNOSIS — Z136 Encounter for screening for cardiovascular disorders: Secondary | ICD-10-CM | POA: Diagnosis not present

## 2019-09-20 DIAGNOSIS — Z13220 Encounter for screening for lipoid disorders: Secondary | ICD-10-CM | POA: Diagnosis not present

## 2019-09-20 DIAGNOSIS — Z713 Dietary counseling and surveillance: Secondary | ICD-10-CM | POA: Diagnosis not present

## 2019-09-20 DIAGNOSIS — Z131 Encounter for screening for diabetes mellitus: Secondary | ICD-10-CM | POA: Diagnosis not present

## 2019-09-20 DIAGNOSIS — Z6828 Body mass index (BMI) 28.0-28.9, adult: Secondary | ICD-10-CM | POA: Diagnosis not present

## 2019-10-13 NOTE — Telephone Encounter (Signed)
error 

## 2019-10-18 DIAGNOSIS — Z124 Encounter for screening for malignant neoplasm of cervix: Secondary | ICD-10-CM | POA: Diagnosis not present

## 2019-10-20 DIAGNOSIS — M549 Dorsalgia, unspecified: Secondary | ICD-10-CM | POA: Diagnosis not present

## 2019-11-14 DIAGNOSIS — F324 Major depressive disorder, single episode, in partial remission: Secondary | ICD-10-CM | POA: Diagnosis not present

## 2019-11-14 DIAGNOSIS — G90529 Complex regional pain syndrome I of unspecified lower limb: Secondary | ICD-10-CM | POA: Diagnosis not present

## 2019-11-14 DIAGNOSIS — K219 Gastro-esophageal reflux disease without esophagitis: Secondary | ICD-10-CM | POA: Diagnosis not present

## 2019-11-18 DIAGNOSIS — G894 Chronic pain syndrome: Secondary | ICD-10-CM | POA: Diagnosis not present

## 2019-11-18 DIAGNOSIS — M79671 Pain in right foot: Secondary | ICD-10-CM | POA: Diagnosis not present

## 2019-11-18 DIAGNOSIS — G5771 Causalgia of right lower limb: Secondary | ICD-10-CM | POA: Diagnosis not present

## 2019-12-01 DIAGNOSIS — M5441 Lumbago with sciatica, right side: Secondary | ICD-10-CM | POA: Diagnosis not present

## 2019-12-01 DIAGNOSIS — M542 Cervicalgia: Secondary | ICD-10-CM | POA: Diagnosis not present

## 2019-12-01 DIAGNOSIS — M47896 Other spondylosis, lumbar region: Secondary | ICD-10-CM | POA: Diagnosis not present

## 2019-12-01 DIAGNOSIS — S9301XA Subluxation of right ankle joint, initial encounter: Secondary | ICD-10-CM | POA: Diagnosis not present

## 2019-12-05 ENCOUNTER — Ambulatory Visit: Payer: BC Managed Care – PPO | Attending: Internal Medicine

## 2019-12-06 DIAGNOSIS — M5441 Lumbago with sciatica, right side: Secondary | ICD-10-CM | POA: Diagnosis not present

## 2019-12-06 DIAGNOSIS — S9301XA Subluxation of right ankle joint, initial encounter: Secondary | ICD-10-CM | POA: Diagnosis not present

## 2019-12-06 DIAGNOSIS — Z20828 Contact with and (suspected) exposure to other viral communicable diseases: Secondary | ICD-10-CM | POA: Diagnosis not present

## 2019-12-06 DIAGNOSIS — M47896 Other spondylosis, lumbar region: Secondary | ICD-10-CM | POA: Diagnosis not present

## 2019-12-06 DIAGNOSIS — M542 Cervicalgia: Secondary | ICD-10-CM | POA: Diagnosis not present

## 2019-12-08 DIAGNOSIS — R05 Cough: Secondary | ICD-10-CM | POA: Diagnosis not present

## 2019-12-08 DIAGNOSIS — R0981 Nasal congestion: Secondary | ICD-10-CM | POA: Diagnosis not present

## 2019-12-08 DIAGNOSIS — Z03818 Encounter for observation for suspected exposure to other biological agents ruled out: Secondary | ICD-10-CM | POA: Diagnosis not present

## 2019-12-09 DIAGNOSIS — S9301XA Subluxation of right ankle joint, initial encounter: Secondary | ICD-10-CM | POA: Diagnosis not present

## 2019-12-09 DIAGNOSIS — M5441 Lumbago with sciatica, right side: Secondary | ICD-10-CM | POA: Diagnosis not present

## 2019-12-09 DIAGNOSIS — M542 Cervicalgia: Secondary | ICD-10-CM | POA: Diagnosis not present

## 2019-12-09 DIAGNOSIS — M47896 Other spondylosis, lumbar region: Secondary | ICD-10-CM | POA: Diagnosis not present

## 2019-12-12 DIAGNOSIS — M542 Cervicalgia: Secondary | ICD-10-CM | POA: Diagnosis not present

## 2019-12-12 DIAGNOSIS — S9301XA Subluxation of right ankle joint, initial encounter: Secondary | ICD-10-CM | POA: Diagnosis not present

## 2019-12-12 DIAGNOSIS — M5441 Lumbago with sciatica, right side: Secondary | ICD-10-CM | POA: Diagnosis not present

## 2019-12-12 DIAGNOSIS — M47896 Other spondylosis, lumbar region: Secondary | ICD-10-CM | POA: Diagnosis not present

## 2019-12-15 DIAGNOSIS — M542 Cervicalgia: Secondary | ICD-10-CM | POA: Diagnosis not present

## 2019-12-15 DIAGNOSIS — M47896 Other spondylosis, lumbar region: Secondary | ICD-10-CM | POA: Diagnosis not present

## 2019-12-15 DIAGNOSIS — S9301XA Subluxation of right ankle joint, initial encounter: Secondary | ICD-10-CM | POA: Diagnosis not present

## 2019-12-15 DIAGNOSIS — M5441 Lumbago with sciatica, right side: Secondary | ICD-10-CM | POA: Diagnosis not present

## 2019-12-16 DIAGNOSIS — M542 Cervicalgia: Secondary | ICD-10-CM | POA: Diagnosis not present

## 2019-12-16 DIAGNOSIS — M5441 Lumbago with sciatica, right side: Secondary | ICD-10-CM | POA: Diagnosis not present

## 2019-12-16 DIAGNOSIS — S9301XA Subluxation of right ankle joint, initial encounter: Secondary | ICD-10-CM | POA: Diagnosis not present

## 2019-12-16 DIAGNOSIS — M47896 Other spondylosis, lumbar region: Secondary | ICD-10-CM | POA: Diagnosis not present

## 2019-12-20 DIAGNOSIS — M5441 Lumbago with sciatica, right side: Secondary | ICD-10-CM | POA: Diagnosis not present

## 2019-12-20 DIAGNOSIS — S9301XA Subluxation of right ankle joint, initial encounter: Secondary | ICD-10-CM | POA: Diagnosis not present

## 2019-12-20 DIAGNOSIS — M542 Cervicalgia: Secondary | ICD-10-CM | POA: Diagnosis not present

## 2019-12-20 DIAGNOSIS — M47896 Other spondylosis, lumbar region: Secondary | ICD-10-CM | POA: Diagnosis not present

## 2019-12-23 DIAGNOSIS — J069 Acute upper respiratory infection, unspecified: Secondary | ICD-10-CM | POA: Diagnosis not present

## 2019-12-25 DIAGNOSIS — Z20828 Contact with and (suspected) exposure to other viral communicable diseases: Secondary | ICD-10-CM | POA: Diagnosis not present

## 2019-12-28 ENCOUNTER — Ambulatory Visit: Payer: BC Managed Care – PPO | Admitting: Physician Assistant

## 2019-12-30 DIAGNOSIS — Z1152 Encounter for screening for COVID-19: Secondary | ICD-10-CM | POA: Diagnosis not present

## 2020-01-09 DIAGNOSIS — R05 Cough: Secondary | ICD-10-CM | POA: Diagnosis not present

## 2020-01-09 DIAGNOSIS — R1011 Right upper quadrant pain: Secondary | ICD-10-CM | POA: Diagnosis not present

## 2020-01-09 DIAGNOSIS — M542 Cervicalgia: Secondary | ICD-10-CM | POA: Diagnosis not present

## 2020-01-09 DIAGNOSIS — R112 Nausea with vomiting, unspecified: Secondary | ICD-10-CM | POA: Diagnosis not present

## 2020-01-09 DIAGNOSIS — S9301XA Subluxation of right ankle joint, initial encounter: Secondary | ICD-10-CM | POA: Diagnosis not present

## 2020-01-09 DIAGNOSIS — M5441 Lumbago with sciatica, right side: Secondary | ICD-10-CM | POA: Diagnosis not present

## 2020-01-09 DIAGNOSIS — M47896 Other spondylosis, lumbar region: Secondary | ICD-10-CM | POA: Diagnosis not present

## 2020-01-10 DIAGNOSIS — R1011 Right upper quadrant pain: Secondary | ICD-10-CM | POA: Diagnosis not present

## 2020-01-11 ENCOUNTER — Ambulatory Visit (INDEPENDENT_AMBULATORY_CARE_PROVIDER_SITE_OTHER): Payer: BC Managed Care – PPO | Admitting: Physician Assistant

## 2020-01-11 ENCOUNTER — Encounter: Payer: Self-pay | Admitting: Physician Assistant

## 2020-01-11 ENCOUNTER — Other Ambulatory Visit: Payer: BC Managed Care – PPO

## 2020-01-11 VITALS — BP 124/68 | HR 92 | Ht 63.0 in | Wt 157.0 lb

## 2020-01-11 DIAGNOSIS — R112 Nausea with vomiting, unspecified: Secondary | ICD-10-CM

## 2020-01-11 DIAGNOSIS — R1013 Epigastric pain: Secondary | ICD-10-CM

## 2020-01-11 DIAGNOSIS — R102 Pelvic and perineal pain: Secondary | ICD-10-CM

## 2020-01-11 MED ORDER — PROMETHAZINE HCL 25 MG PO TABS: 25.0000 mg | ORAL_TABLET | Freq: Four times a day (QID) | ORAL | 1 refills | Status: DC | PRN

## 2020-01-11 MED ORDER — HYDROCORTISONE ACETATE 25 MG RE SUPP: RECTAL | 2 refills | Status: DC

## 2020-01-11 MED ORDER — PANTOPRAZOLE SODIUM 40 MG PO TBEC: DELAYED_RELEASE_TABLET | ORAL | 6 refills | Status: AC

## 2020-01-11 NOTE — Patient Instructions (Addendum)
If you are age 35 or older, your body mass index should be between 23-30. Your Body mass index is 27.81 kg/m. If this is out of the aforementioned range listed, please consider follow up with your Primary Care Provider.  If you are age 1 or younger, your body mass index should be between 19-25. Your Body mass index is 27.81 kg/m. If this is out of the aformentioned range listed, please consider follow up with your Primary Care Provider.   You have been scheduled for an abdominal ultrasound at [facility] (1st floor of hospital) on 01/19/20 at 2:00 pm. Please arrive 15 minutes prior to your appointment for registration. Make certain not to have anything to eat or drink 6 hours prior to your appointment. Should you need to reschedule your appointment, please contact radiology at [phone]. This test typically takes about 30 minutes to perform.  We have sent the following medications to your pharmacy for you to pick up at your convenience: Anusol Suppositories have been sent to your pharmacy. Use 1 per rectum every night for 7-10 nights.  Refills of Promethazine and pantoprazole have been sent in to your pharmacy.  Your provider has requested that you go to the basement level for lab work before leaving today. Press "B" on the elevator. The lab is located at the first door on the left as you exit the elevator.

## 2020-01-12 ENCOUNTER — Encounter: Payer: Self-pay | Admitting: Physician Assistant

## 2020-01-12 DIAGNOSIS — Z3490 Encounter for supervision of normal pregnancy, unspecified, unspecified trimester: Secondary | ICD-10-CM | POA: Diagnosis not present

## 2020-01-12 LAB — URINE CULTURE
MICRO NUMBER:: 10209750
SPECIMEN QUALITY:: ADEQUATE

## 2020-01-12 NOTE — Progress Notes (Signed)
Reviewed and agree with management plan.  Issam Carlyon T. Fatina Sprankle, MD FACG [facility]  

## 2020-01-12 NOTE — Progress Notes (Signed)
Subjective:    Patient ID: Amber Pope, female    DOB: 08-29-1986, 34 y.o.   MRN: [id number]  HPI [name] is a pleasant 34 year old white female, established with Dr. Fuller Plan who was last seen in our office in 2018 by myself at that time with GERD and constipation. She comes in today with 6 to 7-week history of constant nausea and vomiting.  She had delivered a baby in October 2020. She was having some recent issues with anxiety and depression and had been started on a trial of Zoloft, it was unclear whether this was causing GI symptoms and she was then switched to Prozac.  She feels the Prozac is helping her but she has continued to have problems with nausea and vomiting.  She has had some mild intermittent epigastric pain, no fever or chills.  No heartburn or indigestion.  She is having regular bowel movements says a bit looser than usual.  She is also had some recent hemorrhoidal symptoms and has had 2 occasions where she has seen some bright red blood. She reports that her PCP Dr. Drema Dallas had checked a beta hCG earlier this week which was mildly elevated and she is supposed to return there tomorrow for repeat beta-hCG. She reports she has been under a lot of stress with new baby, another small child, and her husband just recently was laid off.  She says all of her GI symptoms started prior to that event however.  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 She has remote history of a gastric ulcer I believe when she was living in [place] several years ago. EGD here in 2012 per Dr. Olevia Perches was negative. She reports other recent labs were unremarkable per PCP No regular aspirin or NSAID use, no EtOH  Review of Systems Pertinent positive and negative review of systems were noted in the above HPI section.  All other review of systems was otherwise negative.  Outpatient Encounter Medications as of 01/11/2020  Medication Sig  . ALPRAZolam (XANAX) 0.5 MG tablet Take 0.5 mg by mouth at bedtime as needed for anxiety. As  needed 1/2 tab  . aluminum hydroxide-magnesium carbonate (GAVISCON) 95-358 MG/15ML SUSP Take by mouth.  . doxylamine, Sleep, (UNISOM) 25 MG tablet Take 25 mg by mouth at bedtime as needed.  . famotidine (PEPCID) 20 MG tablet Take 20 mg by mouth 2 (two) times daily.  Marland Kitchen FLUoxetine (PROZAC) 10 MG tablet Take 10 mg by mouth daily.  . magnesium oxide (MAG-OX) 400 (241.3 Mg) MG tablet Take 1 tablet (400 mg total) by mouth daily.  . Multiple Vitamin (VITAMIN E/FOLIC XVQM/G-8/Q-76 PO) Take by mouth.  . pantoprazole (PROTONIX) 40 MG tablet 1 tablet daily po 45 min prior to breakfast  . promethazine (PHENERGAN) 25 MG tablet Take 1 tablet (25 mg total) by mouth every 6 (six) hours as needed for nausea or vomiting.  . VENTOLIN HFA 108 (90 Base) MCG/ACT inhaler TAKE 2 PUFFS BY MOUTH EVERY 6 HOURS AS NEEDED FOR WHEEZE OR SHORTNESS OF BREATH  . [DISCONTINUED] benzonatate (TESSALON) 100 MG capsule Take 2 capsules (200 mg total) by mouth 3 (three) times daily as needed for cough.  . [DISCONTINUED] chlorpheniramine-HYDROcodone (TUSSIONEX PENNKINETIC ER) 10-8 MG/5ML SUER Take 5 mLs by mouth every 12 (twelve) hours as needed for cough.  . [DISCONTINUED] ibuprofen (ADVIL) 800 MG tablet Take 1 tablet (800 mg total) by mouth every 6 (six) hours.  . [DISCONTINUED] pantoprazole (PROTONIX) 40 MG tablet 1 tablet daily po 45 min prior to breakfast  . [  DISCONTINUED] promethazine (PHENERGAN) 25 MG tablet Take 25 mg by mouth every 6 (six) hours as needed for nausea or vomiting.  . hydrocortisone (ANUSOL-HC) 25 MG suppository 1 suppository per rectum at bed time for 7-10 days as needed  . [DISCONTINUED] cholecalciferol (VITAMIN D3) 25 MCG (1000 UT) tablet Take 1 tablet (1,000 Units total) by mouth daily. (Patient not taking: Reported on 12/20/2018)  . [DISCONTINUED] promethazine-dextromethorphan (PROMETHAZINE-DM) 6.25-15 MG/5ML syrup Take 5 mLs by mouth at bedtime. (Patient not taking: Reported on 12/20/2018)   No  facility-administered encounter medications on file as of 01/11/2020.   Allergies  Allergen Reactions  . Lodine [Etodolac] Anaphylaxis and Rash    Has taken ibuprofen in past with no problems, per patient  . Cortisone Other (See Comments)    Questionable allergy?   Patient Active Problem List   Diagnosis Date Noted  . Encounter for induction of labor 08/17/2019  . Gestational hypertension 08/17/2019  . Perineal laceration, second degree 08/17/2019  . Vitamin D deficiency 09/20/2018  . Wrist tendonitis 09/20/2018  . History of asthma 09/04/2017  . Chronic gastric ulcer without hemorrhage and without perforation 06/11/2017  . Gastroesophageal reflux disease 06/11/2017  . Family history of esophageal cancer 06/11/2017  . Complex regional pain syndrome type 1 06/11/2017  . SVD (spontaneous vaginal delivery) 03/30/2017  . Postpartum care following vaginal delivery 10/7 03/30/2017   Social History   Socioeconomic History  . Marital status: Married    Spouse name: Not on file  . Number of children: 1  . Years of education: Not on file  . Highest education level: Not on file  Occupational History    Employer: [employer]  Tobacco Use  . Smoking status: Former Smoker    Quit date: 2017    Years since quitting: 4.1  . Smokeless tobacco: Never Used  . Tobacco comment: prior social smoker, not prior regular smoker  Substance and Sexual Activity  . Alcohol use: No    Comment: occasional  . Drug use: Not Currently    Types: Marijuana    Comment: 2 times weekly   . Sexual activity: Yes  Other Topics Concern  . 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  Photographer, was model and actress prior.  Exercise - walks, goes to gym 2 days per week.   Diet - most home cooked, healthy.   09/2018   Social Determinants of Health   Financial Resource Strain:   . Difficulty of Paying Living Expenses: Not on file  Food Insecurity:   . Worried About Charity fundraiser  in the Last Year: Not on file  . Ran Out of Food in the Last Year: Not on file  Transportation Needs:   . Lack of Transportation (Medical): Not on file  . Lack of Transportation (Non-Medical): Not on file  Physical Activity:   . Days of Exercise per Week: Not on file  . Minutes of Exercise per Session: Not on file  Stress:   . Feeling of Stress : Not on file  Social Connections:   . Frequency of Communication with Friends and Family: Not on file  . Frequency of Social Gatherings with Friends and Family: Not on file  . Attends Religious Services: Not on file  . Active Member of Clubs or Organizations: Not on file  . Attends Archivist Meetings: Not on file  . Marital Status: Not on file  Intimate Partner Violence:   . Fear of  Current or Ex-Partner: Not on file  . Emotionally Abused: Not on file  . Physically Abused: Not on file  . Sexually Abused: Not on file    Ms. Sawyers's family history includes Barrett's esophagus in her father; COPD in her maternal grandfather and maternal grandmother; Dementia in her maternal grandmother; Depression in her mother; Diabetes in her maternal grandfather and paternal grandfather; Esophageal cancer in her father; GER disease in her brother, father, mother, and son; Hyperlipidemia in her mother; Hypertension in her father; Other in her son; Sleep apnea in her brother; Ulcers in her brother.      Objective:    Vitals:   01/11/20 1507  BP: 124/68  Pulse: 92    Physical Exam Well-developed well-nourished female in no acute distress.  Height, Weight, BMI 27.8  HEENT; nontraumatic normocephalic, EOMI, PER R LA, sclera anicteric. Oropharynx; not examined Neck; supple, no JVD Cardiovascular; regular rate and rhythm with S1-S2, no murmur rub or gallop Pulmonary; Clear bilaterally Abdomen; soft, no focal tenderness nondistended, no palpable mass or hepatosplenomegaly, bowel sounds are active Rectal; not done today Skin; benign exam, no  jaundice rash or appreciable lesions Extremities; no clubbing cyanosis or edema skin warm and dry Neuro/Psych; alert and oriented x4, grossly nonfocal mood and affect appropriate       Assessment & Plan:   #53 34 year old white female with 6 to 7-week history of persistent nausea and vomiting intermittent but occurring on an almost daily basis.  She is also had intermittent epigastric pain. Patient is on chronic PPI therapy for GERD, doubt peptic ulcer disease Rule out biliary colic, gallbladder disease. Patient also had a minimally elevated beta hCG earlier this week and has repeat testing scheduled.  Rule out early pregnancy  #2 anxiety/depression #3 isolated episode of small-volume hematochezia likely hemorrhoidal. 4 history of ureteral lithiasis  Plan; continue Protonix 40 mg p.o. every morning, refill sent May continue Phenergan 12.5 to 25 mg every 6 hours as needed for nausea Schedule for upper abdominal ultrasound UA Follow-up on beta hCG, patient is scheduled for repeat labs with Dr. Drema Dallas office.  If this returns positive have asked to call and speak with my nurse for further advice.    Kamon Fahr S Kayli Beal PA-C 01/12/2020   Cc: No ref. provider found

## 2020-01-16 ENCOUNTER — Telehealth: Payer: Self-pay | Admitting: Physician Assistant

## 2020-01-16 ENCOUNTER — Other Ambulatory Visit: Payer: Self-pay

## 2020-01-16 DIAGNOSIS — R112 Nausea with vomiting, unspecified: Secondary | ICD-10-CM

## 2020-01-16 NOTE — Telephone Encounter (Signed)
Called back. No answer. Left a message to call back to discuss.

## 2020-01-16 NOTE — Telephone Encounter (Signed)
Patient calls back. She tells me she cannot eat anything without vomiting. Yesterday the emesis had "red specks in it and something that was black." Also "it burns right there where my ulcer was last time" referring to her upper abdomen. Afebrile. Wants her u/s moved to tomorrow.  Only able to get it moved up by 1 day to 01/18/20 from 01/19/20.  Please advise on the vomiting. Any change to the plan?

## 2020-01-16 NOTE — Telephone Encounter (Signed)
Called the patient's phone. No answer. Left a message asking she call me back.

## 2020-01-16 NOTE — Telephone Encounter (Signed)
I think we need to get labs on her-CBC with differential, c-Met and lipase. Will see what ultrasound shows on 01/18/2020  She should have a prescription for Phenergan, she cannot keep anything down okay to give her Phenergan suppositories 25 mg every 8 hours as needed 1 box and 1 refill  I also need to know the results of her repeat pregnancy test which she was supposed to have through her PCP at the end of last week.  Also she is miserable and unable to keep anything down, ER certainly reasonable 

## 2020-01-16 NOTE — Telephone Encounter (Signed)
Patient calls back Discussed the plan with her. She agrees to the lab work. Declines phenergan suppositories at this time. She states she will eat very bland and very small amounts. States she does not vomit up undigested food, only a liquid.  Pregnancy test is negative. Agrees to come for labs tomorrow. Abdominal u/s Wednesday 01/18/20

## 2020-01-18 ENCOUNTER — Other Ambulatory Visit: Payer: Self-pay

## 2020-01-18 ENCOUNTER — Ambulatory Visit (HOSPITAL_COMMUNITY)
Admission: RE | Admit: 2020-01-18 | Discharge: 2020-01-18 | Disposition: A | Discharge status: Home / Self Care | Payer: BC Managed Care – PPO | Source: Ambulatory Visit | Attending: Physician Assistant | Admitting: Physician Assistant

## 2020-01-18 DIAGNOSIS — R109 Unspecified abdominal pain: Secondary | ICD-10-CM | POA: Diagnosis not present

## 2020-01-18 DIAGNOSIS — R1013 Epigastric pain: Secondary | ICD-10-CM

## 2020-01-18 DIAGNOSIS — R102 Pelvic and perineal pain: Secondary | ICD-10-CM | POA: Diagnosis not present

## 2020-01-18 DIAGNOSIS — R112 Nausea with vomiting, unspecified: Secondary | ICD-10-CM | POA: Insufficient documentation

## 2020-01-19 ENCOUNTER — Other Ambulatory Visit (HOSPITAL_COMMUNITY): Payer: BC Managed Care – PPO

## 2020-01-23 ENCOUNTER — Telehealth: Payer: Self-pay | Admitting: Physician Assistant

## 2020-01-23 NOTE — Telephone Encounter (Signed)
She had a really bad sinus infection.

## 2020-01-23 NOTE — Telephone Encounter (Signed)
Can she still have the procedure if she has been on Prednisone?

## 2020-01-23 NOTE — Telephone Encounter (Signed)
Yes, I believe so - what is she on prednisone for ?

## 2020-01-23 NOTE — Telephone Encounter (Signed)
Patient is calling is wanting to know if it is ok for her to be on Prednisone for 8 days now and is scheduled for 02/02/20 for EGD

## 2020-01-25 ENCOUNTER — Other Ambulatory Visit (INDEPENDENT_AMBULATORY_CARE_PROVIDER_SITE_OTHER): Payer: BC Managed Care – PPO

## 2020-01-25 ENCOUNTER — Ambulatory Visit (AMBULATORY_SURGERY_CENTER): Payer: Self-pay | Admitting: *Deleted

## 2020-01-25 ENCOUNTER — Other Ambulatory Visit: Payer: Self-pay

## 2020-01-25 VITALS — Temp 97.1°F | Ht 63.0 in | Wt 156.0 lb

## 2020-01-25 DIAGNOSIS — Z01818 Encounter for other preprocedural examination: Secondary | ICD-10-CM

## 2020-01-25 DIAGNOSIS — R112 Nausea with vomiting, unspecified: Secondary | ICD-10-CM

## 2020-01-25 DIAGNOSIS — R1013 Epigastric pain: Secondary | ICD-10-CM

## 2020-01-25 LAB — COMPREHENSIVE METABOLIC PANEL
ALT: 7 U/L (ref 0–35)
AST: 8 U/L (ref 0–37)
Albumin: 4.3 g/dL (ref 3.5–5.2)
Alkaline Phosphatase: 58 U/L (ref 39–117)
BUN: 12 mg/dL (ref 6–23)
CO2: 23 mEq/L (ref 19–32)
Calcium: 8.9 mg/dL (ref 8.4–10.5)
Chloride: 105 mEq/L (ref 96–112)
Creatinine, Ser: 0.89 mg/dL (ref 0.40–1.20)
GFR: 72.46 mL/min (ref >60.00)
Glucose, Bld: 121 mg/dL — ABNORMAL HIGH (ref 70–99)
Potassium: 4.2 mEq/L (ref 3.5–5.1)
Sodium: 135 mEq/L (ref 135–145)
Total Bilirubin: 0.4 mg/dL (ref 0.2–1.2)
Total Protein: 7.3 g/dL (ref 6.0–8.3)

## 2020-01-25 LAB — CBC WITH DIFFERENTIAL/PLATELET
Basophils Absolute: 0 10*3/uL (ref 0.0–0.1)
Basophils Relative: 0.3 % (ref 0.0–3.0)
Eosinophils Absolute: 0 10*3/uL (ref 0.0–0.7)
Eosinophils Relative: 0.1 % (ref 0.0–5.0)
HCT: 43.1 % (ref 36.0–46.0)
Hemoglobin: 14.4 g/dL (ref 12.0–15.0)
Lymphocytes Relative: 12.5 % (ref 12.0–46.0)
Lymphs Abs: 1.9 10*3/uL (ref 0.7–4.0)
MCHC: 33.4 g/dL (ref 30.0–36.0)
MCV: 84.3 fl (ref 78.0–100.0)
Monocytes Absolute: 0.5 10*3/uL (ref 0.1–1.0)
Monocytes Relative: 3.2 % (ref 3.0–12.0)
Neutro Abs: 12.7 10*3/uL — ABNORMAL HIGH (ref 1.4–7.7)
Neutrophils Relative %: 83.9 % — ABNORMAL HIGH (ref 43.0–77.0)
Platelets: 346 10*3/uL (ref 150.0–400.0)
RBC: 5.11 Mil/uL (ref 3.87–5.11)
RDW: 15.4 % (ref 11.5–15.5)
WBC: 15.2 10*3/uL — ABNORMAL HIGH (ref 4.0–10.5)

## 2020-01-25 LAB — LIPASE: Lipase: 11 U/L (ref 11.0–59.0)

## 2020-01-25 NOTE — Telephone Encounter (Signed)
Should be no problem for her to undergo a procedure

## 2020-01-25 NOTE — Telephone Encounter (Signed)
Patient was advised.  

## 2020-01-25 NOTE — Progress Notes (Signed)
Patient is here in-person for PV. Patient denies any allergies to eggs or soy. Patient denies any problems with anesthesia/sedation. Patient denies any oxygen use at home. Patient denies taking any diet/weight loss medications or blood thinners. Patient is not being treated for MRSA or C-diff. EMMI education assisgned to the patient for the procedure, this was explained and instructions given to patient. COVID-19 screening test is on 3/23, the pt is aware.  Patient is aware of our care-partner policy and 0000000 safety protocol.

## 2020-01-27 ENCOUNTER — Encounter: Payer: Self-pay | Admitting: Gastroenterology

## 2020-01-31 NOTE — Progress Notes (Signed)
Encounter opened in error

## 2020-02-01 ENCOUNTER — Telehealth: Payer: Self-pay

## 2020-02-01 NOTE — Telephone Encounter (Signed)
Please note that pt cancelled her EGD on 02/02/20 but has rescheduled procedure to 02/08/20.

## 2020-02-01 NOTE — Telephone Encounter (Signed)
Thank you for the update. GM 

## 2020-02-02 ENCOUNTER — Encounter: Payer: BC Managed Care – PPO | Admitting: Gastroenterology

## 2020-02-06 ENCOUNTER — Other Ambulatory Visit: Payer: Self-pay | Admitting: Gastroenterology

## 2020-02-06 ENCOUNTER — Ambulatory Visit (INDEPENDENT_AMBULATORY_CARE_PROVIDER_SITE_OTHER): Payer: BC Managed Care – PPO

## 2020-02-06 DIAGNOSIS — Z1159 Encounter for screening for other viral diseases: Secondary | ICD-10-CM | POA: Diagnosis not present

## 2020-02-07 DIAGNOSIS — F4321 Adjustment disorder with depressed mood: Secondary | ICD-10-CM | POA: Diagnosis not present

## 2020-02-07 LAB — SARS CORONAVIRUS 2 (TAT 6-24 HRS): SARS Coronavirus 2: NEGATIVE

## 2020-02-08 ENCOUNTER — Encounter: Payer: Self-pay | Admitting: Gastroenterology

## 2020-02-08 ENCOUNTER — Ambulatory Visit (AMBULATORY_SURGERY_CENTER): Payer: BC Managed Care – PPO | Admitting: Gastroenterology

## 2020-02-08 ENCOUNTER — Other Ambulatory Visit: Payer: Self-pay

## 2020-02-08 VITALS — BP 131/95 | HR 93 | Temp 97.8°F | Resp 16 | Ht 63.0 in | Wt 156.0 lb

## 2020-02-08 DIAGNOSIS — R112 Nausea with vomiting, unspecified: Secondary | ICD-10-CM

## 2020-02-08 DIAGNOSIS — K219 Gastro-esophageal reflux disease without esophagitis: Secondary | ICD-10-CM

## 2020-02-08 DIAGNOSIS — R109 Unspecified abdominal pain: Secondary | ICD-10-CM | POA: Diagnosis not present

## 2020-02-08 DIAGNOSIS — K228 Other specified diseases of esophagus: Secondary | ICD-10-CM | POA: Diagnosis not present

## 2020-02-08 MED ORDER — ONDANSETRON 8 MG PO TBDP: 8.0000 mg | ORAL_TABLET | Freq: Three times a day (TID) | ORAL | 2 refills | Status: DC | PRN

## 2020-02-08 MED ORDER — SODIUM CHLORIDE 0.9 % IV SOLN: 500.0000 mL | Freq: Once | INTRAVENOUS | Status: AC

## 2020-02-08 NOTE — Progress Notes (Signed)
Report given to PACU, vss 

## 2020-02-08 NOTE — Progress Notes (Signed)
Pt's states no medical or surgical changes since previsit or office visit. 

## 2020-02-08 NOTE — Patient Instructions (Signed)
May resume previous diet Will schedule CT of abd and pelvis  YOU HAD AN ENDOSCOPIC PROCEDURE TODAY AT [facility]:   Refer to the procedure report that was given to you for any specific questions about what was found during the examination.  If the procedure report does not answer your questions, please call your gastroenterologist to clarify.  If you requested that your care partner not be given the details of your procedure findings, then the procedure report has been included in a sealed envelope for you to review at your convenience later.  YOU SHOULD EXPECT: Some feelings of bloating in the abdomen. Passage of more gas than usual.  Walking can help get rid of the air that was put into your GI tract during the procedure and reduce the bloating. If you had a lower endoscopy (such as a colonoscopy or flexible sigmoidoscopy) you may notice spotting of blood in your stool or on the toilet paper. If you underwent a bowel prep for your procedure, you may not have a normal bowel movement for a few days.  Please Note:  You might notice some irritation and congestion in your nose or some drainage.  This is from the oxygen used during your procedure.  There is no need for concern and it should clear up in a day or so.  SYMPTOMS TO REPORT IMMEDIATELY:     Following upper endoscopy (EGD)  Vomiting of blood or coffee ground material  New chest pain or pain under the shoulder blades  Painful or persistently difficult swallowing  New shortness of breath  Fever of 100F or higher  Black, tarry-looking stools  For urgent or emergent issues, a gastroenterologist can be reached at any hour by calling [phone]. Do not use MyChart messaging for urgent concerns.    DIET:  We do recommend a small meal at first, but then you may proceed to your regular diet.  Drink plenty of fluids but you should avoid alcoholic beverages for 24 hours.  ACTIVITY:  You should plan to take it easy for  the rest of today and you should NOT DRIVE or use heavy machinery until tomorrow (because of the sedation medicines used during the test).    FOLLOW UP: Our staff will call the number listed on your records 48-72 hours following your procedure to check on you and address any questions or concerns that you may have regarding the information given to you following your procedure. If we do not reach you, we will leave a message.  We will attempt to reach you two times.  During this call, we will ask if you have developed any symptoms of COVID 19. If you develop any symptoms (ie: fever, flu-like symptoms, shortness of breath, cough etc.) before then, please call [phone].  If you test positive for Covid 19 in the 2 weeks post procedure, please call and report this information to [place].    If any biopsies were taken you will be contacted by phone or by letter within the next 1-3 weeks.  Please call us at [phone] if you have not heard about the biopsies in 3 weeks.    SIGNATURES/CONFIDENTIALITY: You and/or your care partner have signed paperwork which will be entered into your electronic medical record.  These signatures attest to the fact that that the information above on your After Visit Summary has been reviewed and is understood.  Full responsibility of the confidentiality of this discharge information lies with you and/or your care-partner.

## 2020-02-08 NOTE — Op Note (Signed)
[facility] Patient Name: Amber Pope Procedure Date: 02/08/2020 10:32 AM MRN: [id number] Endoscopist: Justice Britain , MD Age: 34 Referring MD:  Date of Birth: 03-04-1986 Gender: Female Account #: [id number] Procedure:                Upper GI endoscopy Indications:              Epigastric abdominal pain, Abdominal pain in the                            right upper quadrant, Nausea with vomiting Medicines:                Monitored Anesthesia Care Procedure:                Pre-Anesthesia Assessment:                           - Prior to the procedure, a History and Physical                            was performed, and patient medications and                            allergies were reviewed. The patient's tolerance of                            previous anesthesia was also reviewed. The risks                            and benefits of the procedure and the sedation                            options and risks were discussed with the patient.                            All questions were answered, and informed consent                            was obtained. Prior Anticoagulants: The patient has                            taken no previous anticoagulant or antiplatelet                            agents. ASA Grade Assessment: II - A patient with                            mild systemic disease. After reviewing the risks                            and benefits, the patient was deemed in                            satisfactory condition to undergo the procedure.  After obtaining informed consent, the endoscope was                            passed under direct vision. Throughout the                            procedure, the patient's blood pressure, pulse, and                            oxygen saturations were monitored continuously. The                            Endoscope was introduced through the mouth, and                            advanced  to the second part of duodenum. The upper                            GI endoscopy was accomplished without difficulty.                            The patient tolerated the procedure. Scope In: Scope Out: Findings:                 No gross lesions were noted in the entire                            esophagus. Biopsies were taken with a cold forceps                            for histology to rule out EoE/LoE.                           The Z-line was regular and was found 40 cm from the                            incisors.                           No gross lesions were noted in the entire examined                            stomach. Biopsies were taken with a cold forceps                            for histology and Helicobacter pylori testing.                           No gross lesions were noted in the duodenal bulb,                            in the first portion of the duodenum and in the  second portion of the duodenum. Biopsies for                            histology were taken with a cold forceps for                            evaluation of celiac disease. Complications:            No immediate complications. Estimated Blood Loss:     Estimated blood loss was minimal. Impression:               - No gross lesions in esophagus. Biopsied. Z-line                            regular, 40 cm from the incisors.                           - No gross lesions in the stomach. Biopsied.                           - No gross lesions in the duodenal bulb, in the                            first portion of the duodenum and in the second                            portion of the duodenum. Biopsied. Recommendation:           - The patient will be observed post-procedure,                            until all discharge criteria are met.                           - Discharge patient to home.                           - Patient has a contact number available for                             emergencies. The signs and symptoms of potential                            delayed complications were discussed with the                            patient. Return to normal activities tomorrow.                            Written discharge instructions were provided to the                            patient.                           -  Resume previous diet.                           - May continue Phenergan for anti-nausea effects.                           - Give prescription for Compazine Suppositories to                            be used PRN up to 2 times per day.                           - Recommend CT-Abdomen/Pelvis with IV/PO contrast                            for further workup of persistent symptoms.                           - If negative CTAP, then will likely need a SF-GES                            and a HIDA scan if symptoms persist.                           - The findings and recommendations were discussed                            with the patient.                           - The findings and recommendations were discussed                            with the patient's family. Justice Britain, MD 02/08/2020 10:59:21 AM

## 2020-02-08 NOTE — Progress Notes (Signed)
Called to room to assist during endoscopic procedure.  Patient ID and intended procedure confirmed with present staff. Received instructions for my participation in the procedure from the performing physician.  

## 2020-02-09 DIAGNOSIS — R05 Cough: Secondary | ICD-10-CM | POA: Diagnosis not present

## 2020-02-11 ENCOUNTER — Encounter: Payer: Self-pay | Admitting: Gastroenterology

## 2020-02-13 ENCOUNTER — Telehealth: Payer: Self-pay | Admitting: *Deleted

## 2020-02-13 ENCOUNTER — Other Ambulatory Visit: Payer: Self-pay

## 2020-02-13 DIAGNOSIS — R112 Nausea with vomiting, unspecified: Secondary | ICD-10-CM

## 2020-02-13 NOTE — Telephone Encounter (Signed)
  Follow up Call-  Call back number 02/08/2020  Post procedure Call Back phone  # [phone]  Permission to leave phone message Yes  Some recent data might be hidden     Patient questions:  Do you have a fever, pain , or abdominal swelling? No. Pain Score  0 *  Have you tolerated food without any problems? Yes.    Have you been able to return to your normal activities? Yes.    Do you have any questions about your discharge instructions: Diet   No. Medications  No. Follow up visit  No.  Do you have questions or concerns about your Care? No.  Actions: * If pain score is 4 or above: No action needed, pain <4.  1. Have you developed a fever since your procedure? no  2.   Have you had an respiratory symptoms (SOB or cough) since your procedure? no  3.   Have you tested positive for COVID 19 since your procedure no  4.   Have you had any family members/close contacts diagnosed with the COVID 19 since your procedure?  no   If yes to any of these questions please route to Joylene John, RN and Erenest Rasher, RN

## 2020-02-21 ENCOUNTER — Telehealth: Payer: Self-pay | Admitting: Gastroenterology

## 2020-02-21 ENCOUNTER — Ambulatory Visit (HOSPITAL_COMMUNITY): Payer: BC Managed Care – PPO

## 2020-02-21 DIAGNOSIS — R112 Nausea with vomiting, unspecified: Secondary | ICD-10-CM

## 2020-02-21 NOTE — Telephone Encounter (Signed)
Patient returned your call states she called the hospital to confirm appt and they told her it for scheduled for 02/22/20. She would like to reschedule for as soon as possible

## 2020-02-21 NOTE — Telephone Encounter (Signed)
Left message on machine to call back   Dr Rush Landmark I called the pt to ask about the CT scan she no showed.  No answer but I did leave a message.

## 2020-02-21 NOTE — Telephone Encounter (Signed)
CT [facility] on 02/27/20 at 330 pm NPO 4 hours.  Drink contrast at 130 pm and 230 pm arrive at 315 pm  The pt has been instructed and all questions answered.

## 2020-02-22 NOTE — Telephone Encounter (Signed)
Thank you for update. GM 

## 2020-02-27 ENCOUNTER — Other Ambulatory Visit: Payer: Self-pay

## 2020-02-27 ENCOUNTER — Ambulatory Visit (HOSPITAL_COMMUNITY)
Admission: RE | Admit: 2020-02-27 | Discharge: 2020-02-27 | Disposition: A | Discharge status: Home / Self Care | Payer: BC Managed Care – PPO | Source: Ambulatory Visit | Attending: Gastroenterology | Admitting: Gastroenterology

## 2020-02-27 DIAGNOSIS — N200 Calculus of kidney: Secondary | ICD-10-CM | POA: Diagnosis not present

## 2020-02-27 DIAGNOSIS — R112 Nausea with vomiting, unspecified: Secondary | ICD-10-CM | POA: Diagnosis not present

## 2020-02-27 DIAGNOSIS — D259 Leiomyoma of uterus, unspecified: Secondary | ICD-10-CM | POA: Diagnosis not present

## 2020-02-27 MED ORDER — SODIUM CHLORIDE (PF) 0.9 % IJ SOLN
INTRAMUSCULAR | Status: AC
  Filled 2020-02-27: qty 50

## 2020-02-27 MED ORDER — IOHEXOL 300 MG/ML  SOLN
100.0000 mL | Freq: Once | INTRAMUSCULAR | Status: AC | PRN
  Administered 2020-02-27: 16:00:00 100 mL via INTRAVENOUS

## 2020-02-29 ENCOUNTER — Other Ambulatory Visit: Payer: Self-pay

## 2020-02-29 DIAGNOSIS — N200 Calculus of kidney: Secondary | ICD-10-CM

## 2020-03-07 NOTE — Telephone Encounter (Signed)
MyChart message to patient sent. FYI Amber Pope and Patty for patient reply and OK to move forward with the notation for labs and studies if she desires. Should she want a 2nd opinion I am OK with that as well but I do think there is additional workup to be considered.

## 2020-03-08 ENCOUNTER — Telehealth: Payer: Self-pay

## 2020-03-08 ENCOUNTER — Ambulatory Visit: Payer: BC Managed Care – PPO | Admitting: Physician Assistant

## 2020-03-08 DIAGNOSIS — R112 Nausea with vomiting, unspecified: Secondary | ICD-10-CM

## 2020-03-08 NOTE — Telephone Encounter (Signed)
Patty, please move forward with the laboratories as well as the radiology studies as outlined in previous MyChart note. Thanks. GM

## 2020-03-08 NOTE — Telephone Encounter (Signed)
Dear Ms. Amberg, I am sorry to hear about the continued issues that you are experiencing. Certainly with the findings on the CT scan, we would continue to worry about this has playing a role. If you do not feel that this is playing a role as it has for you in the past we respect this. As I had written on your procedure note, there are 2 additional tests that we should consider for your continued issues. 1) Gastric Emptying Study.  This will help [place] understand how your stomach is propelling food and whether it is not emptying appropriately leading to your symptoms.  This is a radiology study.  We know from your endoscopy and your CT scan that you do not have an obstructive lesion causing issues but this would be a test to consider and we can set that up with Radiology soon. 2) HIDA scan.  This is a radiology study.  This will evaluate the emptying of your gallbladder to see if there is dysfunction with the emptying process.  Your ultrasound and CT show no significant issues to your gallbladder in regards to stone disease but this will effectively try to rule this out as well. 3) Lab tests to evaluate your thyroid function and your steroid level (Cortisol) to check for reasons for nausea and vomiting, but that would not explain pain. If you are OK with moving forward with the laboratory tests, then we can get these set up for you to come in as well as recheck how your liver tests are doing that would be reasonable. We can move forward with scheduling both of the radiology studies as well. Please let us know if you are OK and we will work on putting in orders and trying to further understand your underlying issues. You are certainly entitled to another opinion if you wish, but I do think the above workup would be reasonable to further exclude potentially treatable causes of your symptoms. Good luck and good health.  Justice Britain, MD

## 2020-03-13 NOTE — Telephone Encounter (Signed)
The pt has been advised of all appointments via My Chart.     Your appointment for tomorrow with Amy has been cancelled.  We will discuss follow up after all testing is complete.     You have been scheduled for a HIDA scan at [facility] (1st floor) on 03/27/20. Please arrive 30 minutes prior to your scheduled appointment at  1 pm. Make certain not to have anything to eat or drink at least 6 hours prior to your test. Should this appointment date or time not work well for you, please call radiology scheduling at [phone].  _______________________________________________ hepatobiliary (HIDA) scan is an imaging procedure used to diagnose problems in the liver, gallbladder and bile ducts. In the HIDA scan, a radioactive chemical or tracer is injected into a vein in your arm. The tracer is handled by the liver like bile. Bile is a fluid produced and excreted by your liver that helps your digestive system break down fats in the foods you eat. Bile is stored in your gallbladder and the gallbladder releases the bile when you eat a meal. A special nuclear medicine scanner (gamma camera) tracks the flow of the tracer from your liver into your gallbladder and small intestine.  During your HIDA scan  You'll be asked to change into a hospital gown before your HIDA scan begins. Your health care team will position you on a table, usually on your back. The radioactive tracer is then injected into a vein in your arm.The tracer travels through your bloodstream to your liver, where it's taken up by the bile-producing cells. The radioactive tracer travels with the bile from your liver into your gallbladder and through your bile ducts to your small intestine.You may feel some pressure while the radioactive tracer is injected into your vein. As you lie on the table, a special gamma camera is positioned over your abdomen taking pictures of the tracer as it moves through your body. The gamma camera takes pictures  continually for about an hour. You'll need to keep still during the HIDA scan. This can become uncomfortable, but you may find that you can lessen the discomfort by taking deep breaths and thinking about other things. Tell your health care team if you're uncomfortable. The radiologist will watch on a computer the progress of the radioactive tracer through your body. The HIDA scan may be stopped when the radioactive tracer is seen in the gallbladder and enters your small intestine. This typically takes about an hour. In some cases extra imaging will be performed if original images aren't satisfactory, if morphine is given to help visualize the gallbladder or if the medication CCK is given to look at the contraction of the gallbladder. This test typically takes 2 hours to complete. _______________________________________________  Amber Pope have been scheduled for a gastric emptying scan at [facility] on 04/03/20 at 730 am. Please arrive at least 30 minutes prior to your appointment for registration. Please make certain not to have anything to eat or drink after midnight the night before your test. Hold all stomach medications (ex: Zofran, phenergan, Reglan) 48 hours prior to your test. If you need to reschedule your appointment, please contact radiology scheduling at [phone]. _______________________________________________ A gastric-emptying study measures how long it takes for food to move through your stomach. There are several ways to measure stomach emptying. In the most common test, you eat food that contains a small amount of radioactive material. A scanner that detects the movement of the radioactive material is  placed over your abdomen to monitor the rate at which food leaves your stomach. This test normally takes about 4 hours to complete. _______________________________________________ Your physician has requested that you come in for lab work.  You do not need an appointment.  Our basement  lab is open 7:30 am-5 pm.  You do not need to be fasting. The address is [address].  [place] [address].

## 2020-03-14 ENCOUNTER — Ambulatory Visit: Payer: BC Managed Care – PPO | Admitting: Physician Assistant

## 2020-03-14 DIAGNOSIS — R8271 Bacteriuria: Secondary | ICD-10-CM | POA: Diagnosis not present

## 2020-03-14 DIAGNOSIS — N202 Calculus of kidney with calculus of ureter: Secondary | ICD-10-CM | POA: Diagnosis not present

## 2020-03-27 ENCOUNTER — Other Ambulatory Visit: Payer: Self-pay

## 2020-03-27 ENCOUNTER — Encounter (HOSPITAL_COMMUNITY): Admission: RE | Admit: 2020-03-27 | Payer: BC Managed Care – PPO | Source: Ambulatory Visit

## 2020-04-03 ENCOUNTER — Encounter (HOSPITAL_COMMUNITY): Admission: RE | Admit: 2020-04-03 | Payer: BC Managed Care – PPO | Source: Ambulatory Visit

## 2020-04-11 DIAGNOSIS — J019 Acute sinusitis, unspecified: Secondary | ICD-10-CM | POA: Diagnosis not present

## 2020-04-11 DIAGNOSIS — R05 Cough: Secondary | ICD-10-CM | POA: Diagnosis not present

## 2020-04-12 ENCOUNTER — Other Ambulatory Visit: Payer: Self-pay

## 2020-04-12 ENCOUNTER — Encounter (HOSPITAL_COMMUNITY)
Admission: RE | Admit: 2020-04-12 | Discharge: 2020-04-12 | Disposition: A | Discharge status: Home / Self Care | Payer: Managed Care, Other (non HMO) | Source: Ambulatory Visit | Attending: Gastroenterology | Admitting: Gastroenterology

## 2020-04-12 DIAGNOSIS — R112 Nausea with vomiting, unspecified: Secondary | ICD-10-CM

## 2020-04-12 DIAGNOSIS — R1011 Right upper quadrant pain: Secondary | ICD-10-CM | POA: Diagnosis not present

## 2020-04-12 MED ORDER — TECHNETIUM TC 99M MEBROFENIN IV KIT
5.1800 | PACK | Freq: Once | INTRAVENOUS | Status: AC | PRN
  Administered 2020-04-12: 5.18 via INTRAVENOUS

## 2020-04-13 ENCOUNTER — Other Ambulatory Visit: Payer: Self-pay

## 2020-04-13 DIAGNOSIS — R112 Nausea with vomiting, unspecified: Secondary | ICD-10-CM

## 2020-05-03 ENCOUNTER — Telehealth: Payer: Self-pay | Admitting: Gastroenterology

## 2020-05-03 ENCOUNTER — Encounter (HOSPITAL_COMMUNITY): Admission: RE | Admit: 2020-05-03 | Payer: Managed Care, Other (non HMO) | Source: Ambulatory Visit

## 2020-05-03 NOTE — Telephone Encounter (Signed)
Patient is calling, states that she was supposed to have the Gastric Emptying this morning, but she started throwing up this morning and ended up having to drink some water due to her throat burning really bad. States that she got a notification on my chart that her appointment in August had been cancelled and she was not sure why, she is going to the ED right now because she states the pain and throwing up has been going on for days now. But she states she did say she wanted to still see Dr. Rush Landmark.

## 2020-05-03 NOTE — Telephone Encounter (Signed)
appt has been rescheduled for office visit on 8/5.  Pt aware

## 2020-05-22 ENCOUNTER — Encounter (HOSPITAL_COMMUNITY)
Admission: RE | Admit: 2020-05-22 | Discharge: 2020-05-22 | Disposition: A | Discharge status: Home / Self Care | Payer: Managed Care, Other (non HMO) | Source: Ambulatory Visit | Attending: Gastroenterology | Admitting: Gastroenterology

## 2020-05-22 ENCOUNTER — Other Ambulatory Visit: Payer: Self-pay

## 2020-05-22 DIAGNOSIS — R112 Nausea with vomiting, unspecified: Secondary | ICD-10-CM | POA: Diagnosis not present

## 2020-05-22 MED ORDER — TECHNETIUM TC 99M SULFUR COLLOID
2.0000 | Freq: Once | INTRAVENOUS | Status: AC | PRN
  Administered 2020-05-22: 2 via INTRAVENOUS

## 2020-05-29 ENCOUNTER — Telehealth: Payer: Self-pay

## 2020-05-29 DIAGNOSIS — R112 Nausea with vomiting, unspecified: Secondary | ICD-10-CM

## 2020-05-29 MED ORDER — SUCRALFATE 1 GM/10ML PO SUSP
1.0000 g | Freq: Four times a day (QID) | ORAL | 1 refills | Status: AC
Start: 2020-05-29 — End: ?

## 2020-05-29 NOTE — Telephone Encounter (Signed)
Patty, I have read the patient's patient advised. I would have her initiate Carafate liquid 3-4 times daily. She should be using rectal suppositories if she cannot tolerate the oral intake of the antiemetics. She has not had any new labs for quite a while, and I think we need to get them just to ensure her electrolytes are not out of whack with the issues that she continues to express. Please have her come in for a CMP as well as the cortisol and TSH that we had previously asked for her to have done. I would do a alpha galactosidase evaluation to ensure she does not have a red meat allergy. She should have follow-up with one of our APP's or myself in the next few weeks. Let her get the labs done before.  Thanks. GM

## 2020-05-29 NOTE — Telephone Encounter (Signed)
The pt has been advised via My Chart and labs entered.  Prescription for Carafate has also been sent.

## 2020-06-12 ENCOUNTER — Other Ambulatory Visit (INDEPENDENT_AMBULATORY_CARE_PROVIDER_SITE_OTHER): Payer: Managed Care, Other (non HMO)

## 2020-06-12 DIAGNOSIS — R112 Nausea with vomiting, unspecified: Secondary | ICD-10-CM

## 2020-06-12 LAB — COMPREHENSIVE METABOLIC PANEL
ALT: 4 U/L (ref 0–35)
AST: 10 U/L (ref 0–37)
Albumin: 4.2 g/dL (ref 3.5–5.2)
Alkaline Phosphatase: 56 U/L (ref 39–117)
BUN: 10 mg/dL (ref 6–23)
CO2: 22 mEq/L (ref 19–32)
Calcium: 9.2 mg/dL (ref 8.4–10.5)
Chloride: 105 mEq/L (ref 96–112)
Creatinine, Ser: 0.83 mg/dL (ref 0.40–1.20)
GFR: 78.36 mL/min (ref >60.00)
Glucose, Bld: 108 mg/dL — ABNORMAL HIGH (ref 70–99)
Potassium: 3.7 mEq/L (ref 3.5–5.1)
Sodium: 134 mEq/L — ABNORMAL LOW (ref 135–145)
Total Bilirubin: 0.5 mg/dL (ref 0.2–1.2)
Total Protein: 7.3 g/dL (ref 6.0–8.3)

## 2020-06-12 LAB — TSH: TSH: 0.49 u[IU]/mL (ref 0.35–4.50)

## 2020-06-14 ENCOUNTER — Ambulatory Visit (INDEPENDENT_AMBULATORY_CARE_PROVIDER_SITE_OTHER): Payer: Managed Care, Other (non HMO) | Admitting: Gastroenterology

## 2020-06-14 ENCOUNTER — Ambulatory Visit: Payer: BC Managed Care – PPO | Admitting: Gastroenterology

## 2020-06-14 ENCOUNTER — Encounter: Payer: Self-pay | Admitting: Gastroenterology

## 2020-06-14 VITALS — BP 90/70 | HR 80 | Ht 63.0 in | Wt 140.4 lb

## 2020-06-14 DIAGNOSIS — R112 Nausea with vomiting, unspecified: Secondary | ICD-10-CM | POA: Diagnosis not present

## 2020-06-14 DIAGNOSIS — R634 Abnormal weight loss: Secondary | ICD-10-CM | POA: Diagnosis not present

## 2020-06-14 DIAGNOSIS — Z79899 Other long term (current) drug therapy: Secondary | ICD-10-CM

## 2020-06-14 DIAGNOSIS — R140 Abdominal distension (gaseous): Secondary | ICD-10-CM

## 2020-06-14 DIAGNOSIS — G8929 Other chronic pain: Secondary | ICD-10-CM

## 2020-06-14 DIAGNOSIS — R1011 Right upper quadrant pain: Secondary | ICD-10-CM | POA: Diagnosis not present

## 2020-06-14 NOTE — Progress Notes (Signed)
[facility] VISIT   Primary Care Provider Rankins, Bill Salinas, MD [place] [address] [phone]  Patient Profile: Amber Pope is a 34 y.o. female with a pmh significant for asthma, allergies, MDD/anxiety, CRPS, nephrolithiasis, hiatal hernia, GERD.  The patient presents to the [facility] for an evaluation and management of problem(s) noted below:  Problem List 1. Abdominal pain, chronic, right upper quadrant   2. Non-intractable vomiting with nausea, unspecified vomiting type   3. Weight loss   4. Abdominal bloating   5. Medication management     History of Present Illness Please see prior consultation note by [facility] and telephone notes for full details of HPI.  Interval History The patient returns for scheduled follow-up.  She continues to have recurrent right upper quadrant abdominal pain with associated discomfort and anorexia.  Weight loss has persisted with some of it being unintentional.  Our structural evaluation at this point has been unremarkable including our endoscopic evaluation and or cross-sectional imaging.  Nausea persists.  The patient feels that her CRPS has been playing more of a role with things currently.  Her psychiatrist has been working with her medications recently which seem to be doing relatively well.  She still gets abdominal bloating with sometimes having distention.  She has used Gaviscon but does not like the taste of it but it has been somewhat effective at times when she has discomfort.  Carafate was ineffective.  She has continued taking her PPI once daily. She just wants to feel better and is hoping there is an answer.  She does tend towards constipation but this has not been causing her more significant issues that she feels.  GI Review of Systems Positive as above Negative for dysphagia, odynophagia, melena, hematochezia  Review of Systems General: Denies  fevers/chills HEENT: Denies oral lesions Cardiovascular: Denies chest pain/palpitations Pulmonary: Denies shortness of breath Gastroenterological: See HPI Genitourinary: Denies darkened urine Hematological: Denies easy bruising/bleeding Endocrine: Denies temperature intolerance Dermatological: Denies jaundice Psychological: Mood is stable but she hopes to feel better   Medications Current Outpatient Medications  Medication Sig Dispense Refill  . ALPRAZolam (XANAX) 0.5 MG tablet Take 0.5 mg by mouth at bedtime as needed for anxiety. As needed 1/2 tab    . aluminum hydroxide-magnesium carbonate (GAVISCON) 95-358 MG/15ML SUSP Take by mouth.    . DULoxetine (CYMBALTA) 60 MG capsule Take 60 mg by mouth daily.    . famotidine (PEPCID) 20 MG tablet Take 20 mg by mouth 2 (two) times daily.    . magnesium oxide (MAG-OX) 400 (241.3 Mg) MG tablet Take 1 tablet (400 mg total) by mouth daily. (Patient taking differently: Take 400 mg by mouth as needed. ) 90 tablet 4  . mirtazapine (REMERON) 15 MG tablet Take 15 mg by mouth at bedtime.    . montelukast (SINGULAIR) 10 MG tablet Take 10 mg by mouth daily.    . ondansetron (ZOFRAN ODT) 8 MG disintegrating tablet Take 1 tablet (8 mg total) by mouth every 8 (eight) hours as needed for nausea or vomiting. 30 tablet 2  . pantoprazole (PROTONIX) 40 MG tablet 1 tablet daily po 45 min prior to breakfast 30 tablet 6  . promethazine (PHENERGAN) 25 MG tablet Take 1 tablet (25 mg total) by mouth every 6 (six) hours as needed for nausea. 30 tablet 0  . promethazine (PHENERGAN) 25 MG tablet Take 1 tablet (25 mg total) by mouth every 6 (six) hours as needed for  nausea or vomiting. 40 tablet 1  . sucralfate (CARAFATE) 1 GM/10ML suspension Take 10 mLs (1 g total) by mouth 4 (four) times daily. 420 mL 1  . SYEDA 3-0.03 MG tablet Take 1 tablet by mouth daily.    . VENTOLIN HFA 108 (90 Base) MCG/ACT inhaler TAKE 2 PUFFS BY MOUTH EVERY 6 HOURS AS NEEDED FOR WHEEZE OR  SHORTNESS OF BREATH 18 Inhaler 0   Current Facility-Administered Medications  Medication Dose Route Frequency Provider Last Rate Last Admin  . 0.9 %  sodium chloride infusion  500 mL Intravenous Once Mansouraty, Telford Nab., MD        Allergies Allergies  Allergen Reactions  . Lodine [Etodolac] Anaphylaxis and Rash    Has taken ibuprofen in past with no problems, per patient  . Cortisone Other (See Comments)    Questionable allergy?    Histories Past Medical History:  Diagnosis Date  . Allergy   . Anxiety   . Asthma   . Complication of anesthesia    tends to experience "excessive hysteria" post op  . Constipation   . Constipation   . CRPS (complex regional pain syndrome type I)    [place], Dr. Fredric Mare  . Depression   . Family history of cancer    esophageal, father  . Gastric ulcer   . GERD (gastroesophageal reflux disease)   . GERD (gastroesophageal reflux disease)   . Panic disorder   . Renal calculus   . Wears glasses    Past Surgical History:  Procedure Laterality Date  . ABLATION ON ENDOMETRIOSIS N/A 01/06/2013   Procedure: Robotic-Assisted Resection of Endometriosis;  Surgeon: Lovenia Kim, MD;  Location: [facility];  Service: Gynecology;  Laterality: N/A;  . ESOPHAGOGASTRODUODENOSCOPY     2 prior, last in 2016, most recent eval with  GI  . kidney stent placement     with later removal  . LITHOTRIPSY     x 2  . ROBOTIC ASSISTED LAPAROSCOPIC LYSIS OF ADHESION N/A 01/06/2013   Procedure: Robotic-Assisted Laparoscopic Lysis of Adhesions;  Surgeon: Lovenia Kim, MD;  Location: [facility];  Service: Gynecology;  Laterality: N/A;  . TENDON RECONSTRUCTION  10/2015   right foot   Social History   Socioeconomic History  . Marital status: Married    Spouse name: Not on file  . Number of children: 1  . Years of education: Not on file  . Highest education level: Not on file  Occupational History    Employer: [employer]  Tobacco Use  .  Smoking status: Former Smoker    Quit date: 2017    Years since quitting: 4.6  . Smokeless tobacco: Never Used  . Tobacco comment: prior social smoker, not prior regular smoker  Vaping Use  . Vaping Use: Never used  Substance and Sexual Activity  . Alcohol use: No    Comment: occasional  . Drug use: Not Currently    Types: Marijuana    Comment: 2 times weekly   . Sexual activity: Yes  Other Topics Concern  . 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  Photographer, was model and actress prior.  Exercise - walks, goes to gym 2 days per week.   Diet - most home cooked, healthy.   09/2018   Social Determinants of Health   Financial Resource Strain:   . Difficulty of Paying Living Expenses:   Food Insecurity:   . Worried About [employer] of  Food in the Last Year:   . [place] in the Last Year:   Transportation Needs:   . Film/video editor (Medical):   Marland Kitchen Lack of Transportation (Non-Medical):   Physical Activity:   . Days of Exercise per Week:   . Minutes of Exercise per Session:   Stress:   . Feeling of Stress :   Social Connections:   . Frequency of Communication with Friends and Family:   . Frequency of Social Gatherings with Friends and Family:   . Attends Religious Services:   . Active Member of Clubs or Organizations:   . Attends Archivist Meetings:   Marland Kitchen Marital Status:   Intimate Partner Violence:   . Fear of Current or Ex-Partner:   . Emotionally Abused:   Marland Kitchen Physically Abused:   . Sexually Abused:    Family History  Problem Relation Age of Onset  . Diabetes Paternal Grandfather   . Diabetes Maternal Grandfather   . COPD Maternal Grandfather   . Esophageal cancer Father   . GER disease Father   . Barrett's esophagus Father   . Hypertension Father   . GER disease Mother   . Depression Mother   . Hyperlipidemia Mother   . Ulcers Brother   . Sleep apnea Brother   . GER disease Brother   . COPD Maternal  Grandmother   . Dementia Maternal Grandmother   . GER disease Son   . Other Son        anal stenosis  . Heart disease Neg Hx   . Stroke Neg Hx   . Colon cancer Neg Hx   . Stomach cancer Neg Hx   . Colon polyps Neg Hx   . Rectal cancer Neg Hx   . Inflammatory bowel disease Neg Hx   . Liver disease Neg Hx   . Pancreatic cancer Neg Hx    I have reviewed her medical, social, and family history in detail and updated the electronic medical record as necessary.    PHYSICAL EXAMINATION  BP 90/70 (BP Location: Left Arm, Patient Position: Sitting, Cuff Size: Normal)   Pulse 80   Ht _0  (1.6 m)   Wt 140 lb 6 oz (63.7 kg)   LMP 06/10/2020   Breastfeeding No   BMI 24.87 kg/m  Wt Readings from Last 3 Encounters:  06/14/20 140 lb 6 oz (63.7 kg)  02/08/20 156 lb (70.8 kg)  01/25/20 156 lb (70.8 kg)  GEN: NAD, appears stated age, doesn't appear chronically ill PSYCH: Cooperative, without pressured speech EYE: Conjunctivae pink, sclerae anicteric ENT: MMM CV: Nontachycardic RESP: No audible wheezing GI: NABS, soft, tenderness to palpation in the right upper quadrant, a clearly defined Carnett's sign is not elicited today, without rebound MSK/EXT: No lower extremity edema SKIN: No jaundice NEURO:  Alert & Oriented x 3, no focal deficits   REVIEW OF DATA  I reviewed the following data at the time of this encounter:  GI Procedures and Studies  March 2021 EGD - No gross lesions in esophagus. Biopsied. Z-line regular, 40 cm from the incisors. - No gross lesions in the stomach. Biopsied. - No gross lesions in the duodenal bulb, in the first portion of the duodenum and in the second portion of the duodenum. Biopsied. Pathology Diagnosis 1. Surgical [P], duodenal - DUODENAL MUCOSA WITH NO SPECIFIC HISTOPATHOLOGIC CHANGES - NEGATIVE FOR INCREASED INTRAEPITHELIAL LYMPHOCYTES OR VILLOUS ARCHITECTURAL CHANGES 2. Surgical [P], gastric antrum and gastric body - GASTRIC ANTRAL AND  OXYNTIC  MUCOSA WITH NO SPECIFIC HISTOPATHOLOGIC CHANGES - WARTHIN STARRY STAIN IS NEGATIVE FOR HELICOBACTER PYLORI 3. Surgical [P], random esophageal - ESOPHAGEAL SQUAMOUS MUCOSA WITH MILD VASCULAR CONGESTION, AND FOCAL SQUAMOUS BALLOONING, SUGGESTIVE OF MILD REFLUX ESOPHAGITIS - NEGATIVE FOR INCREASED INTRAEPITHELIAL EOSINOPHILS  Laboratory Studies  Reviewed those in epic  Imaging Studies  March 2021 abdominal ultrasound IMPRESSION: Normal hepatobiliary scan, demonstrating patency of cystic and common bile ducts. Normal gallbladder ejection fraction.  April 2021 CT abdomen pelvis with contrast IMPRESSION: 1. Nonobstructive right nephrolithiasis. 2. Small type 1 hiatal hernia. 3. Small right uterine fibroid. 4. Very small umbilical hernia contains adipose tissue  June 2021 HIDA IMPRESSION: Normal hepatobiliary scan, demonstrating patency of cystic and common bile ducts. Normal gallbladder ejection fraction.  July 2021 gastric emptying study IMPRESSION: Normal gastric emptying study.   ASSESSMENT  Ms. Berenguer is a 34 y.o. female with a pmh significant for asthma, allergies, MDD/anxiety, CRPS, nephrolithiasis, hiatal hernia, GERD.  The patient is seen today for evaluation and management of:  1. Abdominal pain, chronic, right upper quadrant   2. Non-intractable vomiting with nausea, unspecified vomiting type   3. Weight loss   4. Abdominal bloating   5. Medication management    The patient remains hemodynamically stable but clinically seems to still be experiencing significant issues.  Structural evaluation has been negative.  We ruled out obstructive pathology as well as gastric emptying issues.  No other intra-abdominal processes have been found.  We have not performed a colonoscopy although she reports having had one within the last 10 to 15 years.  Certainly can entertain a colonoscopy at some point.  I think the patient meets criteria for functional abdominal pain or functional  nausea and vomiting.  I would usually want to initiate a TCA but the patient has a psychiatrist, Dr. Chucky May that we would need to talk with prior to initiation of any medications.  She does not recall ever using Elavil or a TCA in the past.  I am going to try to rule out a further etiologies including alpha galactosidase as well as cortisol insufficiency.  The patient has had some discomfort within the last 24 hours check her liver tests and her pancreatic enzymes.  For her bloating even though she does not have the diarrhea that is typical of bacterial overgrowth or EPI we will proceed with work-up for that.  We will work on getting her prior colonoscopy report from [place].  After discussion with the patient's psychiatrist if it is felt that a TCA may be able to be used for neuromodulation we will try to initiate that if it is felt okay and if the above work-up is unremarkable.  We certainly will consider the role of a diagnostic colonoscopy though I am not sure it is going to give [place] an answer.  All patient questions were answered to the best of my ability, and the patient agrees to the aforementioned plan of action with follow-up as indicated.   PLAN  Laboratories as outlined below Fecal elastase to be obtained SIBO breath test to be performed Discussed case with Dr. Chucky May (her Psychiatrist - [phone]) to see if a TCA may be possible for use in future for neuromodulation Diagnostic colonoscopy may be considered in future Obtain prior colonoscopy report from [facility] in [place] This Encounter  Procedures  . Alpha galactosidase  . Cortisol  . Amylase  . Lipase  . Comp Met (CMET)  . Pancreatic elastase,  fecal    New Prescriptions   No medications on file   Modified Medications   No medications on file    Planned Follow Up No follow-ups on file.   Total Time in Face-to-Face and in Coordination of Care for patient including  independent/personal interpretation/review of prior testing, medical history, examination, medication adjustment, communicating results with the patient directly, and documentation with the EHR is 35 minutes.   Justice Britain, MD [facility] Office # [phone]

## 2020-06-14 NOTE — Patient Instructions (Addendum)
Your provider has requested that you go to the basement level for lab work before leaving today. Press "B" on the elevator. The lab is located at the first door on the left as you exit the elevator.  You have been given a testing kit to check for small intestine bacterial overgrowth (SIBO) which is completed by a company named [facility]. Make sure to return your test in the mail using the return mailing label given to you along with the kit. Your demographic and insurance information have already been sent to the company and they should be in contact with you over the next week regarding this test. [facility] will collect an upfront charge of $99.74 for commercial insurance plans and $209.74 is you are paying cash. Make sure to discuss with [facility] PRIOR to having the test if they have gotten informatoin from your insurance company as to how much your testing will cost out of pocket, if any. Please keep in mind that you will be getting a call from phone number [phone] or a similar number. If you do not hear from them within this time frame, please call our office at [phone].   If you are age 28 or older, your body mass index should be between 23-30. Your Body mass index is 24.87 kg/m. If this is out of the aforementioned range listed, please consider follow up with your Primary Care Provider.  If you are age 75 or younger, your body mass index should be between 19-25. Your Body mass index is 24.87 kg/m. If this is out of the aformentioned range listed, please consider follow up with your Primary Care Provider.   Dr.Mansouraty will reach out to Dr. Toy Care regarding a trial of Elavil. Office will contact via mychart with that information.   Thank you for choosing me and [facility].  Dr. Rush Landmark

## 2020-06-15 ENCOUNTER — Encounter: Payer: Self-pay | Admitting: Gastroenterology

## 2020-06-18 ENCOUNTER — Encounter: Payer: Self-pay | Admitting: Gastroenterology

## 2020-06-18 DIAGNOSIS — R634 Abnormal weight loss: Secondary | ICD-10-CM | POA: Insufficient documentation

## 2020-06-18 DIAGNOSIS — Z79899 Other long term (current) drug therapy: Secondary | ICD-10-CM | POA: Insufficient documentation

## 2020-06-18 DIAGNOSIS — R140 Abdominal distension (gaseous): Secondary | ICD-10-CM | POA: Insufficient documentation

## 2020-06-18 DIAGNOSIS — R1110 Vomiting, unspecified: Secondary | ICD-10-CM | POA: Insufficient documentation

## 2020-06-18 DIAGNOSIS — R1011 Right upper quadrant pain: Secondary | ICD-10-CM | POA: Insufficient documentation

## 2020-06-21 LAB — ALPHA GALACTOSIDASE: Alpha-Galactosidase activity: 46.1 nmol/hr/mg prt (ref >35.5)

## 2020-06-25 ENCOUNTER — Other Ambulatory Visit (INDEPENDENT_AMBULATORY_CARE_PROVIDER_SITE_OTHER): Payer: Managed Care, Other (non HMO)

## 2020-06-25 DIAGNOSIS — R1011 Right upper quadrant pain: Secondary | ICD-10-CM | POA: Diagnosis not present

## 2020-06-25 DIAGNOSIS — R634 Abnormal weight loss: Secondary | ICD-10-CM

## 2020-06-25 DIAGNOSIS — R112 Nausea with vomiting, unspecified: Secondary | ICD-10-CM

## 2020-06-25 DIAGNOSIS — G8929 Other chronic pain: Secondary | ICD-10-CM | POA: Diagnosis not present

## 2020-06-25 LAB — COMPREHENSIVE METABOLIC PANEL
ALT: 3 U/L (ref 0–35)
AST: 9 U/L (ref 0–37)
Albumin: 4 g/dL (ref 3.5–5.2)
Alkaline Phosphatase: 55 U/L (ref 39–117)
BUN: 7 mg/dL (ref 6–23)
CO2: 18 mEq/L — ABNORMAL LOW (ref 19–32)
Calcium: 8.5 mg/dL (ref 8.4–10.5)
Chloride: 108 mEq/L (ref 96–112)
Creatinine, Ser: 0.92 mg/dL (ref 0.40–1.20)
GFR: 69.57 mL/min (ref >60.00)
Glucose, Bld: 124 mg/dL — ABNORMAL HIGH (ref 70–99)
Potassium: 4.1 mEq/L (ref 3.5–5.1)
Sodium: 136 mEq/L (ref 135–145)
Total Bilirubin: 0.4 mg/dL (ref 0.2–1.2)
Total Protein: 6.6 g/dL (ref 6.0–8.3)

## 2020-06-25 LAB — AMYLASE: Amylase: 43 U/L (ref 27–131)

## 2020-06-25 LAB — LIPASE: Lipase: 12 U/L (ref 11.0–59.0)

## 2020-06-25 LAB — CORTISOL: Cortisol, Plasma: 8.3 ug/dL

## 2020-06-30 LAB — PANCREATIC ELASTASE, FECAL: Pancreatic Elastase-1, Stool: >500 mcg/g

## 2020-07-05 LAB — ALPHA GALACTOSIDASE: Alpha-Galactosidase activity: 25.8 nmol/hr/mg prt — ABNORMAL LOW (ref >35.5)

## 2020-07-09 ENCOUNTER — Other Ambulatory Visit: Payer: Self-pay

## 2020-07-09 DIAGNOSIS — R634 Abnormal weight loss: Secondary | ICD-10-CM

## 2020-07-09 DIAGNOSIS — R140 Abdominal distension (gaseous): Secondary | ICD-10-CM

## 2020-07-09 DIAGNOSIS — R112 Nausea with vomiting, unspecified: Secondary | ICD-10-CM

## 2020-07-09 DIAGNOSIS — G8929 Other chronic pain: Secondary | ICD-10-CM

## 2020-08-27 ENCOUNTER — Telehealth: Payer: Self-pay | Admitting: Gastroenterology

## 2020-08-27 NOTE — Telephone Encounter (Signed)
I was able to touch base with the patient's psychiatry office. They did not have any contraindication to the consideration of starting low-dose Elavil but would like updates if changes in dosing occur just to be on the safe side since she is already on other antianxiety/antidepression medications. We will initiate Elavil 25 mg nightly in effort of trying to do TCA neuromodulation to see if this helps with her pain/discomfort. Patient will be advised in regards to the potential risks of increased thirst or decreased urination in the setting of the anticholinergic effect. Also since she is on other medication she needs to monitor her symptoms of anxiety and depression closely to ensure she does not have other issues develop. Certainly if she does have issues she should consider decreasing or stopping Elavil and touching base with her psychiatry team. Patty, please move forward with initiating the patient on Elavil 25 mg nightly. She may increase the dose in 3 to 4 weeks to 50 mg nightly or if she wants to split the pill in to 37.5 mg nightly that is okay as well. Please set her up a follow-up in clinic with one of the APP is or myself in approximately 6 to 8 weeks. Also please forward this to the patient's psychiatry team (Rovonda has the information if it is not completely in the chart) Thanks. GM

## 2020-08-28 ENCOUNTER — Encounter: Payer: Self-pay | Admitting: Gastroenterology

## 2020-08-28 ENCOUNTER — Other Ambulatory Visit: Payer: Self-pay

## 2020-08-28 MED ORDER — AMITRIPTYLINE HCL 25 MG PO TABS: 25.0000 mg | ORAL_TABLET | Freq: Every day | ORAL | 3 refills | Status: DC

## 2020-08-28 NOTE — Progress Notes (Signed)
Outside records from [facility] in [place] which will be scanned into the chart   October 2006 clinic note Unable to understand what the assessment is is this is a written note plan was for PPI and EGD and labs  2006 clinic visit Patient with history of chronic reflux as a child on PPIs.  Noted to have father who had Barrett's esophagus and then eventually esophagus cancer as well as a mother with reflux PPI was recommended as well as Symax duo tabs for what was felt to be IBS An EGD was recommended  December 2008 clinic note with assessment concerning for GERD and right upper quadrant pain leading to imaging and labs in follow-up  December 2008 ultrasound Unremarkable exam with no gallstones  December 2008 CT abdomen pelvis without IV contrast 2 small nonobstructing right intrarenal stones.  No ureteral stone or hydronephrosis. Small amount of free fluid in the pelvis. Prominence of the right ovary.  Findings could indicate a hemorrhagic or ruptured ovarian cyst.  Sonography recommended for further evaluation  August 2015 clinic note Patient being seen for initial visit.  Longstanding GERD.  Increasing symptoms including hematemesis and nausea and vomiting.  She has had bloating as well as constipation and diarrhea and early satiety.  She had been trialed on Dexilant up by PCP but not clearly made a difference. Plan was for breath test as well as bethanechol treatment  September 2016 clinic note Patient with multiple GI issues including constipation and bloating and dysphagia and abdominal pain and bright red blood per rectum Patient treated with Amitiza as well as given a 14-day course of Xifaxan although no breath test had been done and patient was to consider EGD:  September 2015 EGD Esophageal hiatal hernia. Normal mucosa in the whole esophagus. Granularity and friability and erythema in the stomach with nonerosive gastritis that was biopsied. Normal  mucosa in the whole examined duodenum.  Biopsied  September 2015 pathology Small bowel biopsies within normal limits Gastric biopsies within normal limits  October 2016 clinic note Patient seen in follow-up.  She felt she had a "worm" she was on bethanechol as well as Protonix.  She had stool studies ordered as well as a trial of IBgard.  February 2017 stool studies negative for Salmonella/Shigella/Campylobacter/E. coli/Cryptosporidium Ova and parasite normal Negative Giardia lamblia antigen  May 2017 clinic note With patient here for follow-up leading to KUB as well as an abdominal cleanse followed by Amitiza and bacterial overgrowth evaluation  Report that on May 2017 patient took a SIBO breath test home  May 2017 KUB Nonspecific nonobstructive bowel gas pattern with a clinical history of constipation  June 2017 clinic note Multiple GI issues remain plan for EGD/colonoscopy as well as promethazine  June 2017 EGD Esophagus.  Small sized hiatal hernia was seen.  Retroflexion confirmed the size and morphology of the hernia.  Grade a esophagitis with biopsies obtained. Stomach.  Patchy discontinuous erythema of the mucosa with no bleeding noted in the stomach.  Cold biopsy forceps used for histology. Duodenum.  Normal mucosa was found in the duodenum.  Cold forcep biopsies were obtained for histology.  June 2017 colonoscopy Grade 1 internal hemorrhoids. Otherwise normal mucosa in the whole colon and terminal ileum.  June 2017 pathology Small bowel biopsies were within normal limits Gastric biopsies were within normal limits Esophageal biopsies showed no intestinal metaplasia  October 2017 clinic note Patient being evaluated for constipation with nausea and vomiting and previously had been on Movantik and  Amitiza and MiraLAX. Multiple GI issues including abdominal pain, abdominal swelling, change in bowel habits, constipation, diarrhea, heartburn, nausea, vomiting, appetite loss,  difficulty swallowing, early satiety, stool incontinence, pain on swallowing, black and tarry stools, rectal bleeding, vomiting blood, coffee-ground Plan for abdominal ultrasound as well as CCK HIDA and to continue Trulance as well as bethanechol and Reglan   These records will be scanned into the chart

## 2020-08-28 NOTE — Telephone Encounter (Signed)
10/16/20 at 1010 am appt made with Dr Rush Landmark and pt aware.  Copy of this message sent to psychiatry team.

## 2020-09-17 ENCOUNTER — Other Ambulatory Visit: Payer: Self-pay

## 2020-09-17 ENCOUNTER — Emergency Department (HOSPITAL_COMMUNITY)
Admission: EM | Admit: 2020-09-17 | Discharge: 2020-09-17 | Disposition: A | Discharge status: Home / Self Care | Payer: Managed Care, Other (non HMO) | Attending: Emergency Medicine | Admitting: Emergency Medicine

## 2020-09-17 DIAGNOSIS — Z87891 Personal history of nicotine dependence: Secondary | ICD-10-CM | POA: Insufficient documentation

## 2020-09-17 DIAGNOSIS — K219 Gastro-esophageal reflux disease without esophagitis: Secondary | ICD-10-CM | POA: Diagnosis not present

## 2020-09-17 DIAGNOSIS — R10811 Right upper quadrant abdominal tenderness: Secondary | ICD-10-CM | POA: Diagnosis not present

## 2020-09-17 DIAGNOSIS — J45909 Unspecified asthma, uncomplicated: Secondary | ICD-10-CM | POA: Diagnosis not present

## 2020-09-17 DIAGNOSIS — Z79899 Other long term (current) drug therapy: Secondary | ICD-10-CM | POA: Diagnosis not present

## 2020-09-17 DIAGNOSIS — R10816 Epigastric abdominal tenderness: Secondary | ICD-10-CM | POA: Insufficient documentation

## 2020-09-17 DIAGNOSIS — R112 Nausea with vomiting, unspecified: Secondary | ICD-10-CM

## 2020-09-17 DIAGNOSIS — R1110 Vomiting, unspecified: Secondary | ICD-10-CM | POA: Diagnosis present

## 2020-09-17 LAB — COMPREHENSIVE METABOLIC PANEL
ALT: 7 U/L (ref 0–44)
AST: 18 U/L (ref 15–41)
Albumin: 4.5 g/dL (ref 3.5–5.0)
Alkaline Phosphatase: 58 U/L (ref 38–126)
Anion gap: 18 — ABNORMAL HIGH (ref 5–15)
BUN: 10 mg/dL (ref 6–20)
CO2: 15 mmol/L — ABNORMAL LOW (ref 22–32)
Calcium: 8.6 mg/dL — ABNORMAL LOW (ref 8.9–10.3)
Chloride: 108 mmol/L (ref 98–111)
Creatinine, Ser: 0.73 mg/dL (ref 0.44–1.00)
GFR, Estimated: >60 mL/min (ref >60)
Glucose, Bld: 140 mg/dL — ABNORMAL HIGH (ref 70–99)
Potassium: 3.5 mmol/L (ref 3.5–5.1)
Sodium: 141 mmol/L (ref 135–145)
Total Bilirubin: 0.6 mg/dL (ref 0.3–1.2)
Total Protein: 7.7 g/dL (ref 6.5–8.1)

## 2020-09-17 LAB — CBC WITH DIFFERENTIAL/PLATELET
Abs Immature Granulocytes: 0.11 10*3/uL — ABNORMAL HIGH (ref 0.00–0.07)
Basophils Absolute: 0.1 10*3/uL (ref 0.0–0.1)
Basophils Relative: 1 %
Eosinophils Absolute: 0 10*3/uL (ref 0.0–0.5)
Eosinophils Relative: 0 %
HCT: 39.5 % (ref 36.0–46.0)
Hemoglobin: 13.8 g/dL (ref 12.0–15.0)
Immature Granulocytes: 1 %
Lymphocytes Relative: 11 %
Lymphs Abs: 2.4 10*3/uL (ref 0.7–4.0)
MCH: 30.5 pg (ref 26.0–34.0)
MCHC: 34.9 g/dL (ref 30.0–36.0)
MCV: 87.4 fL (ref 80.0–100.0)
Monocytes Absolute: 0.6 10*3/uL (ref 0.1–1.0)
Monocytes Relative: 3 %
Neutro Abs: 18.5 10*3/uL — ABNORMAL HIGH (ref 1.7–7.7)
Neutrophils Relative %: 84 %
Platelets: 306 10*3/uL (ref 150–400)
RBC: 4.52 MIL/uL (ref 3.87–5.11)
RDW: 13.2 % (ref 11.5–15.5)
WBC: 21.7 10*3/uL — ABNORMAL HIGH (ref 4.0–10.5)
nRBC: 0 % (ref 0.0–0.2)

## 2020-09-17 LAB — LIPASE, BLOOD: Lipase: 24 U/L (ref 11–51)

## 2020-09-17 MED ORDER — ONDANSETRON HCL 4 MG/2ML IJ SOLN
4.0000 mg | Freq: Once | INTRAMUSCULAR | Status: DC
  Filled 2020-09-17: qty 2

## 2020-09-17 MED ORDER — SODIUM CHLORIDE 0.9 % IV BOLUS
1000.0000 mL | Freq: Once | INTRAVENOUS | Status: AC
  Administered 2020-09-17: 1000 mL via INTRAVENOUS

## 2020-09-17 MED ORDER — HYDROMORPHONE HCL 1 MG/ML IJ SOLN
0.5000 mg | Freq: Once | INTRAMUSCULAR | Status: AC
  Administered 2020-09-17: 0.5 mg via INTRAVENOUS
  Filled 2020-09-17: qty 1

## 2020-09-17 MED ORDER — METOCLOPRAMIDE HCL 5 MG/ML IJ SOLN
10.0000 mg | Freq: Once | INTRAMUSCULAR | Status: AC
  Administered 2020-09-17: 10 mg via INTRAVENOUS
  Filled 2020-09-17: qty 2

## 2020-09-17 MED ORDER — PANTOPRAZOLE SODIUM 40 MG IV SOLR
40.0000 mg | Freq: Once | INTRAVENOUS | Status: AC
  Administered 2020-09-17: 40 mg via INTRAVENOUS
  Filled 2020-09-17: qty 40

## 2020-09-17 NOTE — ED Provider Notes (Signed)
[facility] DEPT Provider Note   CSN: [id number] Arrival date & time: 09/17/20  0540     History Chief Complaint  Patient presents with  . Vomiting    Amber Pope is a 33 y.o. female with a history of complex regional pain syndrome type I, panic disorder, depression, asthma, anxiety, nephrolithiasis, hiatal hernia, GERD who presents the emergency department by EMS with a chief complaint of vomiting.  The patient reports that she went to a football game on 11/7 at 13:00 and consumed several glasses of white wine.  She reports that earlier tonight she developed vomiting x1 before she went to bed.  However, she awoke at 2 AM and has had countless episodes of retching and vomiting since onset.  States that emesis looks dark red like blood.  She does have a history of hematemesis with severe bouts of GERD, but reports that she has never vomited this large of a quantity.  She also endorses burning epigastric pain that is constant and severe.  States that the pain radiates through to her back and up the middle of her chest into her throat. She has a history of similar.  She also reports that she has been intermittently having diarrhea and constipation, which she reports is chronic.  No fever, chills, melena, hematemesis, dysuria, hematuria, vaginal bleeding, or discharge.  She is actively retching in the trash can at bedside.  She attempted to take her home promethazine with no improvement in her symptoms.  She reports that she is also been compliant with her home Protonix.  She is followed by Dr. Rush Landmark with gastroenterology.  She has reportedly had a chronic gastric ulcer without hemorrhage and perforation previously, but she had an upper endoscopy in March 2021 that was unremarkable.  The history is provided by the patient and medical records. No language interpreter was used.       Past Medical History:  Diagnosis Date  . Allergy   . Anxiety   .  Asthma   . Complication of anesthesia    tends to experience "excessive hysteria" post op  . Constipation   . Constipation   . CRPS (complex regional pain syndrome type I)    Leslie, Dr. Fredric Mare  . Depression   . Family history of cancer    esophageal, father  . Gastric ulcer   . GERD (gastroesophageal reflux disease)   . GERD (gastroesophageal reflux disease)   . Panic disorder   . Renal calculus   . Wears glasses     Patient Active Problem List   Diagnosis Date Noted  . Non-intractable vomiting 06/18/2020  . Abdominal pain, chronic, right upper quadrant 06/18/2020  . Unintentional weight loss 06/18/2020  . Abdominal bloating 06/18/2020  . Medication management 06/18/2020  . Encounter for induction of labor 08/17/2019  . Gestational hypertension 08/17/2019  . Perineal laceration, second degree 08/17/2019  . Vitamin D deficiency 09/20/2018  . Wrist tendonitis 09/20/2018  . History of asthma 09/04/2017  . Chronic gastric ulcer without hemorrhage and without perforation 06/11/2017  . Gastroesophageal reflux disease 06/11/2017  . Family history of esophageal cancer 06/11/2017  . Complex regional pain syndrome type 1 06/11/2017  . SVD (spontaneous vaginal delivery) 03/30/2017  . Postpartum care following vaginal delivery 10/7 03/30/2017    Past Surgical History:  Procedure Laterality Date  . ABLATION ON ENDOMETRIOSIS N/A 01/06/2013   Procedure: Robotic-Assisted Resection of Endometriosis;  Surgeon: Lovenia Kim, MD;  Location: [facility];  Service: Gynecology;  Laterality: N/A;  . ESOPHAGOGASTRODUODENOSCOPY     2 prior, last in 2016, most recent eval with [facility]  . kidney stent placement     with later removal  . LITHOTRIPSY     x 2  . ROBOTIC ASSISTED LAPAROSCOPIC LYSIS OF ADHESION N/A 01/06/2013   Procedure: Robotic-Assisted Laparoscopic Lysis of Adhesions;  Surgeon: Lovenia Kim, MD;  Location: [facility];  Service: Gynecology;  Laterality:  N/A;  . TENDON RECONSTRUCTION  10/2015   right foot     OB History    Gravida  2   Para  2   Term  2   Preterm      AB      Living  2     SAB      TAB      Ectopic      Multiple  0   Live Births  2           Family History  Problem Relation Age of Onset  . Diabetes Paternal Grandfather   . Diabetes Maternal Grandfather   . COPD Maternal Grandfather   . Esophageal cancer Father   . GER disease Father   . Barrett's esophagus Father   . Hypertension Father   . GER disease Mother   . Depression Mother   . Hyperlipidemia Mother   . Ulcers Brother   . Sleep apnea Brother   . GER disease Brother   . COPD Maternal Grandmother   . Dementia Maternal Grandmother   . GER disease Son   . Other Son        anal stenosis  . Heart disease Neg Hx   . Stroke Neg Hx   . Colon cancer Neg Hx   . Stomach cancer Neg Hx   . Colon polyps Neg Hx   . Rectal cancer Neg Hx   . Inflammatory bowel disease Neg Hx   . Liver disease Neg Hx   . Pancreatic cancer Neg Hx     Social History   Tobacco Use  . Smoking status: Former Smoker    Quit date: 2017    Years since quitting: 4.8  . Smokeless tobacco: Never Used  . Tobacco comment: prior social smoker, not prior regular smoker  Vaping Use  . Vaping Use: Never used  Substance Use Topics  . Alcohol use: No    Comment: occasional  . Drug use: Not Currently    Types: Marijuana    Comment: 2 times weekly     Home Medications Prior to Admission medications   Medication Sig Start Date End Date Taking? Authorizing Provider  ALPRAZolam Duanne Moron) 0.5 MG tablet Take 0.5 mg by mouth at bedtime as needed for anxiety. As needed 1/2 tab    [provider]  aluminum hydroxide-magnesium carbonate (GAVISCON) 95-358 MG/15ML SUSP Take by mouth.    [provider]  amitriptyline (ELAVIL) 25 MG tablet Take 1 tablet (25 mg total) by mouth at bedtime. 08/28/20 09/27/20  Mansouraty, Telford Nab., MD  DULoxetine (CYMBALTA)  60 MG capsule Take 60 mg by mouth daily. 06/12/20   [provider]  famotidine (PEPCID) 20 MG tablet Take 20 mg by mouth 2 (two) times daily.    [provider]  magnesium oxide (MAG-OX) 400 (241.3 Mg) MG tablet Take 1 tablet (400 mg total) by mouth daily. Patient taking differently: Take 400 mg by mouth as needed.  08/20/19   Artelia Laroche., CNM  mirtazapine (REMERON) 15 MG tablet  Take 15 mg by mouth at bedtime. 06/03/20   [provider]  montelukast (SINGULAIR) 10 MG tablet Take 10 mg by mouth daily. 04/24/20   [provider]  ondansetron (ZOFRAN ODT) 8 MG disintegrating tablet Take 1 tablet (8 mg total) by mouth every 8 (eight) hours as needed for nausea or vomiting. 02/08/20   Mansouraty, Telford Nab., MD  pantoprazole (PROTONIX) 40 MG tablet 1 tablet daily po 45 min prior to breakfast 01/11/20   Esterwood, Amy S, PA-C  promethazine (PHENERGAN) 25 MG tablet Take 1 tablet (25 mg total) by mouth every 6 (six) hours as needed for nausea. 09/16/11 01/10/21  Lafayette Dragon, MD  promethazine (PHENERGAN) 25 MG tablet Take 1 tablet (25 mg total) by mouth every 6 (six) hours as needed for nausea or vomiting. 01/11/20   Esterwood, Amy S, PA-C  sucralfate (CARAFATE) 1 GM/10ML suspension Take 10 mLs (1 g total) by mouth 4 (four) times daily. 05/29/20   Mansouraty, Telford Nab., MD  SYEDA 3-0.03 MG tablet Take 1 tablet by mouth daily. 06/10/20   [provider]  VENTOLIN HFA 108 (90 Base) MCG/ACT inhaler TAKE 2 PUFFS BY MOUTH EVERY 6 HOURS AS NEEDED FOR WHEEZE OR SHORTNESS OF BREATH 12/06/18   Henson, Vickie L, NP-C    Allergies    Lodine [etodolac] and Cortisone  Review of Systems   Review of Systems  Constitutional: Negative for activity change, chills and fever.  HENT: Positive for sore throat. Negative for congestion, ear discharge and ear pain.   Eyes: Negative for visual disturbance.  Respiratory: Negative for cough, shortness of breath and wheezing.     Cardiovascular: Negative for chest pain, palpitations and leg swelling.  Gastrointestinal: Positive for abdominal pain, constipation, diarrhea, nausea and vomiting. Negative for anal bleeding, blood in stool and rectal pain.  Genitourinary: Negative for dysuria, flank pain, frequency, pelvic pain, vaginal bleeding, vaginal discharge and vaginal pain.  Musculoskeletal: Negative for back pain, myalgias, neck pain and neck stiffness.  Skin: Negative for rash.  Allergic/Immunologic: Negative for immunocompromised state.  Neurological: Negative for dizziness, seizures, syncope, weakness, light-headedness and headaches.  Psychiatric/Behavioral: Negative for confusion.    Physical Exam Updated Vital Signs BP (!) 158/94   Pulse 62   Temp 97.8 F (36.6 C) (Oral)   Resp 14   Ht 5\' 3"  (1.6 m)   Wt 63.7 kg   SpO2 100%   BMI 24.88 kg/m   Physical Exam Vitals and nursing note reviewed.  Constitutional:      General: She is in acute distress.     Appearance: Normal appearance. She is not ill-appearing, toxic-appearing or diaphoretic.  HENT:     Head: Normocephalic.     Mouth/Throat:     Mouth: Mucous membranes are moist.     Pharynx: No oropharyngeal exudate or posterior oropharyngeal erythema.  Eyes:     Conjunctiva/sclera: Conjunctivae normal.  Cardiovascular:     Rate and Rhythm: Normal rate and regular rhythm.     Pulses: Normal pulses.     Heart sounds: Normal heart sounds. No murmur heard.  No friction rub. No gallop.   Pulmonary:     Effort: Pulmonary effort is normal. No respiratory distress.     Breath sounds: No stridor. No wheezing, rhonchi or rales.  Chest:     Chest wall: No tenderness.  Abdominal:     General: There is no distension.     Palpations: Abdomen is soft. There is no mass.  Tenderness: There is abdominal tenderness. There is right CVA tenderness. There is no left CVA tenderness, guarding or rebound.     Hernia: No hernia is present.     Comments:  Tender to palpation in the epigastric region and right upper quadrant.  Negative Murphy sign.  Abdomen is soft and nondistended.  No CVA tenderness bilaterally.  Normoactive bowel sounds.  With palpation of the epigastric region, patient begins retching, and the exam is terminated as she is actively vomiting dark, red emesis into the trash can.  Musculoskeletal:     Cervical back: Neck supple.     Right lower leg: No edema.     Left lower leg: No edema.  Skin:    General: Skin is warm.     Capillary Refill: Capillary refill takes less than 2 seconds.     Findings: No rash.  Neurological:     Mental Status: She is alert.  Psychiatric:        Behavior: Behavior normal.     ED Results / Procedures / Treatments   Labs (all labs ordered are listed, but only abnormal results are displayed) Labs Reviewed  CBC WITH DIFFERENTIAL/PLATELET - Abnormal; Notable for the following components:      Result Value   WBC 21.7 (*)    Neutro Abs 18.5 (*)    Abs Immature Granulocytes 0.11 (*)    All other components within normal limits  COMPREHENSIVE METABOLIC PANEL - Abnormal; Notable for the following components:   CO2 15 (*)    Glucose, Bld 140 (*)    Calcium 8.6 (*)    Anion gap 18 (*)    All other components within normal limits  LIPASE, BLOOD  PREGNANCY, URINE    EKG None  Radiology No results found.  Procedures Procedures (including critical care time)  Medications Ordered in ED Medications  sodium chloride 0.9 % bolus 1,000 mL (1,000 mLs Intravenous New Bag/Given 09/17/20 0624)  HYDROmorphone (DILAUDID) injection 0.5 mg (0.5 mg Intravenous Given 09/17/20 0623)  metoCLOPramide (REGLAN) injection 10 mg (10 mg Intravenous Given 09/17/20 0624)  pantoprazole (PROTONIX) injection 40 mg (40 mg Intravenous Given 09/17/20 9211)    ED Course  I have reviewed the triage vital signs and the nursing notes.  Pertinent labs & imaging results that were available during my care of the patient  were reviewed by me and considered in my medical decision making (see chart for details).    MDM Rules/Calculators/A&P                          34 year old female with a history of complex regional pain syndrome type I, panic disorder, depression, asthma, anxiety, nephrolithiasis, hiatal hernia, GERD who presents the emergency department by EMS with hematemesis and upper abdominal pain, onset tonight.  She has been having diarrhea and constipation, but reports this is chronic.  She did note some alcohol use yesterday, but this was more than 12 hours ago.  Vital signs are stable.  On exam, she is tender to palpation in the epigastric region right upper quadrant without peritoneal signs.  She does appear to have hematemesis in the emesis bag and trash can at bedside.   This patient presents to the ED for concern of  hematemesis and upper abdominal pain this involves an extensive number of treatment options, and is a complaint that carries with it a high risk of complications and morbidity.  The differential diagnosis includes peptic ulcer disease, Mallory-Weiss  tear, GERD, cholecystitis, colitis, mesenteric ischemia, diverticulitis, pancreatitis.      Lab Tests:    I Ordered, reviewed, and interpreted labs, which included  CBC with differential, CMP, lipase, pregnancy test, which is pending   Medicines ordered:    I ordered Reglan, morphine, and IV fluids   Additional history obtained:    Previous records obtained and reviewed  Patient was initially reevaluated after initial medications and already looked markedly improved on exam was significantly improved after abdominal pain.  She was able to lay in bed and was no longer actively vomiting or retching.   Patient care transferred to PA Geiple at the end of my shift to follow-up on labs and patient reevaluation. Patient presentation, ED course, and plan of care discussed with review of all pertinent labs and imaging. Please see his/her  note for further details regarding further ED course and disposition.    Final Clinical Impression(s) / ED Diagnoses Final diagnoses:  None    Rx / DC Orders ED Discharge Orders    None       Amber Pope, [place], PA-C 09/17/20 0659    Molpus, Jenny Reichmann, MD 09/17/20 2233

## 2020-09-17 NOTE — ED Notes (Signed)
Patient kept down a cup of sprite and a half cup of water.  Patient requesting her IV be removed and to be discharged.  Patient stated her husband is on the way to get her and that she is feeling much better. Josh, PA made aware.

## 2020-09-17 NOTE — Discharge Instructions (Signed)
Please read and follow all provided instructions.  Your diagnoses today include:  1. Non-intractable vomiting with nausea, unspecified vomiting type     Tests performed today include:  Blood cell counts and platelets - high stress cell level (white blood cells)  Kidney and liver function tests  Pancreas function test (called lipase)  Urine test to look for infection  A blood or urine test for pregnancy (women only) - not able to be collected or checked before you went home  Vital signs. See below for your results today.   Medications prescribed:   None  Take any prescribed medications only as directed.  Home care instructions:   Follow any educational materials contained in this packet.  Follow-up instructions: Please follow-up with your primary care provider in the next 2 days for further evaluation of your symptoms.    Return instructions:  SEEK IMMEDIATE MEDICAL ATTENTION IF:  The pain does not go away or becomes severe   A temperature above 101F develops   Repeated vomiting occurs (multiple episodes)   The pain becomes localized to portions of the abdomen. The right side could possibly be appendicitis. In an adult, the left lower portion of the abdomen could be colitis or diverticulitis.   Blood is being passed in stools or vomit (bright red or black tarry stools)   You develop chest pain, difficulty breathing, dizziness or fainting, or become confused, poorly responsive, or inconsolable (young children)  If you have any other emergent concerns regarding your health  Additional Information: Abdominal (belly) pain can be caused by many things. Your caregiver performed an examination and possibly ordered blood/urine tests and imaging (CT scan, x-rays, ultrasound). Many cases can be observed and treated at home after initial evaluation in the emergency department. Even though you are being discharged home, abdominal pain can be unpredictable. Therefore, you need a  repeated exam if your pain does not resolve, returns, or worsens. Most patients with abdominal pain don't have to be admitted to the hospital or have surgery, but serious problems like appendicitis and gallbladder attacks can start out as nonspecific pain. Many abdominal conditions cannot be diagnosed in one visit, so follow-up evaluations are very important.  Your vital signs today were: BP 140/87   Pulse 65   Temp 97.8 F (36.6 C) (Oral)   Resp 16   Ht 5\' 3"  (1.6 m)   Wt 63.7 kg   SpO2 100%   BMI 24.88 kg/m  If your blood pressure (bp) was elevated above 135/85 this visit, please have this repeated by your doctor within one month. --------------

## 2020-09-17 NOTE — ED Provider Notes (Signed)
8:35 AM Signout from [employer] at shift change.   Patient presented today with vomiting after drinking wine.  She was treated with IV fluids, Protonix, pain medication.  On my recheck, she is feeling much better.  Pain is gone.  No tenderness on exam.  She is calm and conversant.  Fluid challenge given by RN and patient did very well.  Recently notified by RN that patient is caught her husband who is coming to pick her up.  She would like to be discharged.  Urine studies have not been collected are sent.  Patient will be allowed to go once husband has arrived.  BP 115/88   Pulse 77   Temp 97.8 F (36.6 C) (Oral)   Resp 16   Ht 5\' 3"  (1.6 m)   Wt 63.7 kg   SpO2 100%   BMI 24.88 kg/m   The patient was urged to return to the Emergency Department immediately with worsening of current symptoms, worsening abdominal pain, persistent vomiting, blood noted in stools, fever, or any other concerns. The patient verbalized understanding.     Carlisle Cater, PA-C 09/17/20 4628    Dorie Rank, MD 09/19/20 [phone]

## 2020-09-17 NOTE — ED Triage Notes (Signed)
Patient BIB EMS for "vomiting". Patient attended a football game, had several glasses of wine, and started vomiting. Per EMS, there was no actual vomiting on scene or en route, and at arrival patient is coughing/spitting up but not vomiting. CBG was 158. Patient is AxOx4, asking for ice chips/IV/fluids, states "I think I'm dying".

## 2020-10-16 ENCOUNTER — Ambulatory Visit: Payer: Managed Care, Other (non HMO) | Admitting: Gastroenterology

## 2021-04-17 ENCOUNTER — Telehealth: Payer: Self-pay | Admitting: Gastroenterology

## 2021-04-17 NOTE — Telephone Encounter (Signed)
Pt called back and stated she can not make appt tomorrow at 11 due to work. She is sch'd back to see Dr. Rush Landmark on 05/23/21 at 3:10pm.

## 2021-04-17 NOTE — Telephone Encounter (Signed)
Pt states that she has been dealing with severe diarrhea for a while now. She has an appt on 7/14 with Dr. Rush Landmark but would like something sooner or advise on what to do in the meantime.

## 2021-04-17 NOTE — Telephone Encounter (Signed)
The pt has diarrhea that has gotten progressively worse.  She has an appt with Dr Rush Landmark on 7/14 but would like something sooner.  Amy has an appt for tomorrow at 28.  The pt accepted that appt.

## 2021-04-18 ENCOUNTER — Ambulatory Visit: Payer: Managed Care, Other (non HMO) | Admitting: Physician Assistant

## 2021-05-23 ENCOUNTER — Other Ambulatory Visit (INDEPENDENT_AMBULATORY_CARE_PROVIDER_SITE_OTHER): Payer: Managed Care, Other (non HMO)

## 2021-05-23 ENCOUNTER — Ambulatory Visit (INDEPENDENT_AMBULATORY_CARE_PROVIDER_SITE_OTHER): Payer: Managed Care, Other (non HMO) | Admitting: Gastroenterology

## 2021-05-23 ENCOUNTER — Encounter: Payer: Self-pay | Admitting: Gastroenterology

## 2021-05-23 ENCOUNTER — Ambulatory Visit: Payer: Managed Care, Other (non HMO) | Admitting: Gastroenterology

## 2021-05-23 VITALS — BP 100/68 | HR 87 | Ht 64.0 in | Wt 148.0 lb

## 2021-05-23 DIAGNOSIS — R194 Change in bowel habit: Secondary | ICD-10-CM | POA: Diagnosis not present

## 2021-05-23 DIAGNOSIS — K589 Irritable bowel syndrome without diarrhea: Secondary | ICD-10-CM

## 2021-05-23 DIAGNOSIS — G8929 Other chronic pain: Secondary | ICD-10-CM

## 2021-05-23 DIAGNOSIS — R1011 Right upper quadrant pain: Secondary | ICD-10-CM

## 2021-05-23 DIAGNOSIS — R197 Diarrhea, unspecified: Secondary | ICD-10-CM

## 2021-05-23 DIAGNOSIS — K625 Hemorrhage of anus and rectum: Secondary | ICD-10-CM

## 2021-05-23 LAB — COMPREHENSIVE METABOLIC PANEL
ALT: 3 U/L (ref 0–35)
AST: 8 U/L (ref 0–37)
Albumin: 4.1 g/dL (ref 3.5–5.2)
Alkaline Phosphatase: 54 U/L (ref 39–117)
BUN: 11 mg/dL (ref 6–23)
CO2: 26 mEq/L (ref 19–32)
Calcium: 8.9 mg/dL (ref 8.4–10.5)
Chloride: 105 mEq/L (ref 96–112)
Creatinine, Ser: 0.82 mg/dL (ref 0.40–1.20)
GFR: 92.52 mL/min (ref >60.00)
Glucose, Bld: 88 mg/dL (ref 70–99)
Potassium: 4 mEq/L (ref 3.5–5.1)
Sodium: 137 mEq/L (ref 135–145)
Total Bilirubin: 0.2 mg/dL (ref 0.2–1.2)
Total Protein: 7 g/dL (ref 6.0–8.3)

## 2021-05-23 LAB — SEDIMENTATION RATE: Sed Rate: 9 mm/hr (ref 0–20)

## 2021-05-23 LAB — C-REACTIVE PROTEIN: CRP: <1 mg/dL (ref 0.5–20.0)

## 2021-05-23 MED ORDER — HYDROCORTISONE ACETATE 25 MG RE SUPP: 25.0000 mg | Freq: Two times a day (BID) | RECTAL | 0 refills | Status: DC

## 2021-05-23 NOTE — Progress Notes (Signed)
[place] VISIT   Primary Care Provider Rankins, Bill Salinas, MD [place] [address] [phone]  Patient Profile: Amber Pope is a 35 y.o. female with a pmh significant for asthma, allergies, MDD/anxiety, CRPS, nephrolithiasis, hiatal hernia, GERD.  The patient presents to the [facility] for an evaluation and management of problem(s) noted below:  Problem List 1. Diarrhea, unspecified type   2. Change in bowel habits   3. Bright red blood per rectum   4. Irritable bowel syndrome, unspecified type   5. Chronic right upper quadrant pain      History of Present Illness Please see prior consultation note and progress notes for full details of HPI.  Interval History The patient returns for follow-up.  We have not seen her in quite a few months.  She had been doing relatively well taking her PPI to help with her right upper quadrant abdominal discomfort.  As long as her bowels are moving she felt she would have less right upper quadrant pain or have pain that was tolerable.  She changed her diet as well.  During her menstrual cycle however she feels she gets more constipation which can lead to worsening of her symptoms of pain.  Recently, over the course of the last few months she is noted some alteration of her bowel habits.  She is now having some episodes of loose watery stool in the morning.  She has noted a few episodes of bright red blood per rectum and she is never noted bright red blood per rectum previously.  Weight has overall been stable.  She has been able to wean off many of her medications.  Unfortunately her CRPS is beginning to worsen.  At times she still has abdominal bloating and distention.    GI Review of Systems Positive as above Negative for odynophagia, dysphagia, melena   Review of Systems General: Denies fevers/chills/unintentional weight loss Cardiovascular: Denies chest  pain/palpitations Pulmonary: Denies shortness of breath Gastroenterological: See HPI Genitourinary: Denies darkened urine Hematological: Denies easy bruising/bleeding Endocrine: Denies temperature intolerance Dermatological: Denies jaundice Psychological: Mood is stable but she hopes to feel better   Medications Current Outpatient Medications  Medication Sig Dispense Refill   ALPRAZolam (XANAX) 0.5 MG tablet Take 0.5 mg by mouth at bedtime as needed for anxiety. As needed 1/2 tab     aluminum hydroxide-magnesium carbonate (GAVISCON) 95-358 MG/15ML SUSP Take by mouth.     famotidine (PEPCID) 20 MG tablet Take 20 mg by mouth 2 (two) times daily.     mirtazapine (REMERON) 15 MG tablet Take 45 mg by mouth at bedtime.     montelukast (SINGULAIR) 10 MG tablet Take 10 mg by mouth daily.     pantoprazole (PROTONIX) 40 MG tablet 1 tablet daily po 45 min prior to breakfast 30 tablet 6   sucralfate (CARAFATE) 1 GM/10ML suspension Take 10 mLs (1 g total) by mouth 4 (four) times daily. 420 mL 1   SYEDA 3-0.03 MG tablet Take 1 tablet by mouth daily.     VENTOLIN HFA 108 (90 Base) MCG/ACT inhaler TAKE 2 PUFFS BY MOUTH EVERY 6 HOURS AS NEEDED FOR WHEEZE OR SHORTNESS OF BREATH 18 Inhaler 0   hydrocortisone (ANUSOL-HC) 25 MG suppository Place 1 suppository (25 mg total) rectally at bedtime. QHS for 1 week then every other evening until Prescription is complete. 12 suppository 0   Current Facility-Administered Medications  Medication Dose Route Frequency Provider Last Rate Last Admin  0.9 %  sodium chloride infusion  500 mL Intravenous Once Mansouraty, Telford Nab., MD        Allergies Allergies  Allergen Reactions   Lodine [Etodolac] Anaphylaxis and Rash    Has taken ibuprofen in past with no problems, per patient   Cortisone Other (See Comments)    Questionable allergy?    Histories Past Medical History:  Diagnosis Date   Allergy    Anxiety    Asthma    Complication of anesthesia    tends  to experience "excessive hysteria" post op   Constipation    Constipation    CRPS (complex regional pain syndrome type I)    [place], Dr. Fredric Mare   Depression    Family history of cancer    esophageal, father   Gastric ulcer    GERD (gastroesophageal reflux disease)    GERD (gastroesophageal reflux disease)    Panic disorder    Renal calculus    Wears glasses    Past Surgical History:  Procedure Laterality Date   ABLATION ON ENDOMETRIOSIS N/A 01/06/2013   Procedure: Robotic-Assisted Resection of Endometriosis;  Surgeon: Lovenia Kim, MD;  Location: [facility];  Service: Gynecology;  Laterality: N/A;   ESOPHAGOGASTRODUODENOSCOPY     2 prior, last in 2016, most recent eval with [facility]   kidney stent placement     with later removal   LITHOTRIPSY     x 2   ROBOTIC ASSISTED LAPAROSCOPIC LYSIS OF ADHESION N/A 01/06/2013   Procedure: Robotic-Assisted Laparoscopic Lysis of Adhesions;  Surgeon: Lovenia Kim, MD;  Location: [facility];  Service: Gynecology;  Laterality: N/A;   TENDON RECONSTRUCTION  10/2015   right foot   Social History   Socioeconomic History   Marital status: Married    Spouse name: Not on file   Number of children: 1   Years of education: Not on file   Highest education level: Not on file  Occupational History    Employer: [employer]  Tobacco Use   Smoking status: Former    Types: Cigarettes    Quit date: 2017    Years since quitting: 5.5   Smokeless tobacco: Never   Tobacco comments:    prior social smoker, not prior regular smoker  Vaping Use   Vaping Use: Never used  Substance and Sexual Activity   Alcohol use: No    Comment: occasional   Drug use: Not Currently    Types: Marijuana    Comment: 2 times weekly    Sexual activity: Yes  Other Topics Concern   Not on file  Social History Narrative   Lives with husband and son.  Photographer, was model and actress prior.  Exercise - walks, goes to gym 2 days per week.   Diet -  most home cooked, healthy.   09/2018   Social Determinants of Health   Financial Resource Strain: Not on file  Food Insecurity: Not on file  Transportation Needs: Not on file  Physical Activity: Not on file  Stress: Not on file  Social Connections: Not on file  Intimate Partner Violence: Not on file   Family History  Problem Relation Age of Onset   Diabetes Paternal Grandfather    Diabetes Maternal Grandfather    COPD Maternal Grandfather    Esophageal cancer Father    GER disease Father    Barrett's esophagus Father    Hypertension Father    GER disease Mother    Depression Mother  Hyperlipidemia Mother    Ulcers Brother    Sleep apnea Brother    GER disease Brother    COPD Maternal Grandmother    Dementia Maternal Grandmother    GER disease Son    Other Son        anal stenosis   Heart disease Neg Hx    Stroke Neg Hx    Colon cancer Neg Hx    Stomach cancer Neg Hx    Colon polyps Neg Hx    Rectal cancer Neg Hx    Inflammatory bowel disease Neg Hx    Liver disease Neg Hx    Pancreatic cancer Neg Hx    I have reviewed her medical, social, and family history in detail and updated the electronic medical record as necessary.    PHYSICAL EXAMINATION  BP 100/68   Pulse 87   Ht _0  (1.626 m)   Wt 148 lb (67.1 kg)   BMI 25.40 kg/m  Wt Readings from Last 3 Encounters:  05/23/21 148 lb (67.1 kg)  09/17/20 140 lb 6.9 oz (63.7 kg)  06/14/20 140 lb 6 oz (63.7 kg)  GEN: NAD, appears stated age, doesn't appear chronically ill PSYCH: Cooperative, without pressured speech EYE: Conjunctivae pink, sclerae anicteric ENT: MMM CV: Nontachycardic RESP: No audible wheezing GI: NABS, soft, nontender, protuberant abdomen GU: We were going to pursue a DRE but the patient is currently on her menstrual cycle so we have held off on that for patient preference MSK/EXT: No lower extremity edema SKIN: No jaundice NEURO:  Alert & Oriented x 3, no focal deficits   REVIEW OF  DATA  I reviewed the following data at the time of this encounter:  GI Procedures and Studies  We reviewed the 2017 colonoscopy (outside procedure) Normal mucosa in the entire colon and TI.  Retroflexion showed no abnormalities.  Small nonbleeding grade 1 hemorrhoids were noted.   Laboratory Studies  Reviewed those in epic  Imaging Studies  No new studies to review   ASSESSMENT  Ms. Venuti is a 35 y.o. female with a pmh significant for asthma, allergies, MDD/anxiety, CRPS, nephrolithiasis, hiatal hernia, GERD.  The patient is seen today for evaluation and management of:  1. Diarrhea, unspecified type   2. Change in bowel habits   3. Bright red blood per rectum   4. Irritable bowel syndrome, unspecified type   5. Chronic right upper quadrant pain    The patient is hemodynamically stable.  Clinically, she has had an alteration of her bowel habits somewhat.  I still wonder if her diarrheal symptoms are actually constipation with overflow at times because she has almost always done better when where her bowel movements have moved.  She has previously trialed Linzess and Amitiza without success.  She has not trialed Trulance previously so we may consider this in the future.  Patient never initiated Elavil because of concerns of constipation after she discusses with her psychiatrist.  We reviewed the 2017 colonoscopy performed in [place] which did not take biopsies to rule out microscopic/collagenous colitis but showed no evidence of other significant abnormalities other than hemorrhoids.  I suspect her bright red blood per rectum is hemorrhoidal in nature but we were unable to perform a DRE due to the patient's menstrual cycle at this time.  We are going to see how the patient's symptoms go over the course of the next few weeks.  If she continues to have issues and diarrhea persist, and/or bright red blood per rectum,  then endoscopic evaluation.  Colonoscopy is strongly recommended and can be  pursued.  She will update [place] via MyChart in the next 4 weeks.  In the future we may still consider SIBO breath testing.  All patient questions were answered to the best of my ability, and the patient agrees to the aforementioned plan of action with follow-up as indicated.   PLAN  Laboratories as outlined below Stool studies as outlined below Consider SIBO breath testing in the future  Bright red blood per rectum persists after Anusol suppositories then a diagnostic colonoscopy will need to be pursued   Orders Placed This Encounter  Procedures   Stool Culture   Ova and parasite examination   Fecal leukocytes   CBC   Comp Met (CMET)   Sedimentation rate   C-reactive protein   Clostridium difficile Toxin B, Qualitative, Real-Time PCR     New Prescriptions   HYDROCORTISONE (ANUSOL-HC) 25 MG SUPPOSITORY    Place 1 suppository (25 mg total) rectally at bedtime. QHS for 1 week then every other evening until Prescription is complete.   Modified Medications   No medications on file    Planned Follow Up No follow-ups on file.   Total Time in Face-to-Face and in Coordination of Care for patient including independent/personal interpretation/review of prior testing, medical history, examination, medication adjustment, communicating results with the patient directly, and documentation with the EHR is 25 minutes.   Justice Britain, MD [facility] Office # [phone]

## 2021-05-23 NOTE — Patient Instructions (Addendum)
We have sent the following medications to your pharmacy for you to pick up at your convenience: Anusol Suppositories   Only use Anusol of you have recur rectal bleeding.   If you are age 36 or younger, your body mass index should be between 19-25. Your Body mass index is 25.4 kg/m. If this is out of the aformentioned range listed, please consider follow up with your Primary Care Provider.   Send mychart message in 4 weeks to let us know how your doing.   Samples of Linzess 74mcg- Take 1 capsules once daily.  Please keep follow up appointment.   The [facility] providers would like to encourage you to use [facility] to communicate with providers for non-urgent requests or questions.  Due to long hold times on the telephone, sending your provider a message by [facility] may be a faster and more efficient way to get a response.  Please allow 48 business hours for a response.  Please remember that this is for non-urgent requests.   Your provider has requested that you go to the basement level for lab work before leaving today. Press "B" on the elevator. The lab is located at the first door on the left as you exit the elevator.  Thank you for choosing me and [facility].  Dr. Rush Landmark

## 2021-05-24 ENCOUNTER — Encounter: Payer: Self-pay | Admitting: Gastroenterology

## 2021-05-24 DIAGNOSIS — R197 Diarrhea, unspecified: Secondary | ICD-10-CM | POA: Insufficient documentation

## 2021-05-24 DIAGNOSIS — R194 Change in bowel habit: Secondary | ICD-10-CM | POA: Insufficient documentation

## 2021-05-24 DIAGNOSIS — K589 Irritable bowel syndrome without diarrhea: Secondary | ICD-10-CM | POA: Insufficient documentation

## 2021-05-24 DIAGNOSIS — K625 Hemorrhage of anus and rectum: Secondary | ICD-10-CM | POA: Insufficient documentation

## 2021-05-24 LAB — CBC
HCT: 40.2 % (ref 36.0–46.0)
Hemoglobin: 14 g/dL (ref 12.0–15.0)
MCHC: 34.7 g/dL (ref 30.0–36.0)
MCV: 87.7 fl (ref 78.0–100.0)
Platelets: 319 10*3/uL (ref 150.0–400.0)
RBC: 4.59 Mil/uL (ref 3.87–5.11)
RDW: 12.8 % (ref 11.5–15.5)
WBC: 8.5 10*3/uL (ref 4.0–10.5)

## 2021-05-25 MED ORDER — HYDROCORTISONE ACETATE 25 MG RE SUPP: 25.0000 mg | Freq: Every day | RECTAL | 0 refills | Status: AC

## 2021-06-18 ENCOUNTER — Other Ambulatory Visit: Payer: Self-pay | Admitting: Home Modifications

## 2021-06-18 ENCOUNTER — Ambulatory Visit
Admission: RE | Admit: 2021-06-18 | Discharge: 2021-06-18 | Disposition: A | Discharge status: Home / Self Care | Payer: Managed Care, Other (non HMO) | Source: Ambulatory Visit | Attending: Home Modifications | Admitting: Home Modifications

## 2021-06-18 ENCOUNTER — Other Ambulatory Visit: Payer: Self-pay

## 2021-06-18 DIAGNOSIS — J45909 Unspecified asthma, uncomplicated: Secondary | ICD-10-CM

## 2021-07-11 ENCOUNTER — Ambulatory Visit: Payer: Managed Care, Other (non HMO) | Admitting: Gastroenterology

## 2021-09-18 ENCOUNTER — Ambulatory Visit (INDEPENDENT_AMBULATORY_CARE_PROVIDER_SITE_OTHER): Payer: Managed Care, Other (non HMO) | Admitting: Allergy

## 2021-09-18 ENCOUNTER — Encounter: Payer: Self-pay | Admitting: Allergy

## 2021-09-18 ENCOUNTER — Ambulatory Visit: Payer: Self-pay | Admitting: Internal Medicine

## 2021-09-18 ENCOUNTER — Other Ambulatory Visit: Payer: Self-pay

## 2021-09-18 VITALS — BP 104/72 | HR 85 | Temp 98.6°F | Resp 18 | Ht 64.0 in | Wt 152.5 lb

## 2021-09-18 DIAGNOSIS — J45909 Unspecified asthma, uncomplicated: Secondary | ICD-10-CM

## 2021-09-18 DIAGNOSIS — J3089 Other allergic rhinitis: Secondary | ICD-10-CM

## 2021-09-18 DIAGNOSIS — Z87892 Personal history of anaphylaxis: Secondary | ICD-10-CM | POA: Diagnosis not present

## 2021-09-18 DIAGNOSIS — K219 Gastro-esophageal reflux disease without esophagitis: Secondary | ICD-10-CM | POA: Diagnosis not present

## 2021-09-18 DIAGNOSIS — Z872 Personal history of diseases of the skin and subcutaneous tissue: Secondary | ICD-10-CM

## 2021-09-18 MED ORDER — EPINEPHRINE 0.3 MG/0.3ML IJ SOAJ: 0.3000 mg | INTRAMUSCULAR | 1 refills | Status: AC | PRN

## 2021-09-18 MED ORDER — BUDESONIDE 0.5 MG/2ML IN SUSP: 0.5000 mg | Freq: Two times a day (BID) | RESPIRATORY_TRACT | 2 refills | Status: DC

## 2021-09-18 MED ORDER — MONTELUKAST SODIUM 10 MG PO TABS: 10.0000 mg | ORAL_TABLET | Freq: Every day | ORAL | 5 refills | Status: AC

## 2021-09-18 MED ORDER — TRELEGY ELLIPTA 200-62.5-25 MCG/ACT IN AEPB: 1.0000 | INHALATION_SPRAY | Freq: Every day | RESPIRATORY_TRACT | 2 refills | Status: AC

## 2021-09-18 NOTE — Assessment & Plan Note (Signed)
Rhinitis symptoms worse in the fall and spring. No prior skin testing. Takes zyrtec prn with good benefit.  Declines skin prick testing today due to complex regional pain syndrome. Will get bloodwork instead.  Start Singulair (montelukast) 10mg  daily at night - patient tolerated in the past with no issues.   Cautioned that in some children/adults can experience behavioral changes including hyperactivity, agitation, depression, sleep disturbances and suicidal ideations. These side effects are rare, but if you notice them you should notify me and discontinue Singulair (montelukast). . Use over the counter antihistamines such as Zyrtec (cetirizine), Claritin (loratadine), Allegra (fexofenadine), or Xyzal (levocetirizine) daily as needed. May take twice a day during allergy flares. May switch antihistamines every few months.

## 2021-09-18 NOTE — Progress Notes (Signed)
New Patient Note  RE: Amber Pope MRN: [id number] DOB: 03/28/1986 Date of Office Visit: 09/18/2021  Consult requested by: Eilene Ghazi, NP Primary care provider: Aretta Nip, MD  Chief Complaint: Establish Care and Asthma  History of Present Illness: I had the pleasure of seeing Amber Pope for initial evaluation at the [facility] on 09/18/2021. She is a 35 y.o. female, who is referred here by Rankins, Bill Salinas, MD for the evaluation of asthma.  She reports symptoms of chest tightness, shortness of breath, coughing with post tussive emesis, wheezing, nocturnal awakenings for 30+ years but worsening now. Current medications include albuterol prn, Advair Diskus 268mg 1 puffs BID with unknown benefit. She reports not using aerochamber with inhalers. She tried the following inhalers: albuterol nebulizer. Main triggers are infections, dry heat. In the last month, frequency of symptoms: daily. Frequency of nocturnal symptoms: depends. Frequency of SABA use: daily. Interference with physical activity: yes. Sleep is undisturbed. In the last 12 months, emergency room visits/urgent care visits/doctor office visits or hospitalizations due to respiratory issues: 3. In the last 12 months, oral steroids courses: 2x. Lifetime history of hospitalization for respiratory issues: no. Prior intubations: no. History of pneumonia: no. She was not evaluated by allergist/pulmonologist in the past. Smoking exposure: not recently but exposed to smoke as a child. Up to date with flu vaccine: no. Up to date with COVID-19 vaccine: yes. Prior Covid-19 infection: possibly in 2020. History of reflux: Protonix prn  Prednisone caused hyperactivity  06/18/2021 chest X-ray: "IMPRESSION: No active cardiopulmonary disease."  Patient had frequent URIs as a child and her mother was a heavy smoker. Asthma and allergies improved during pregnancy and slowly in 2020 her asthma symptoms are slowly  returning.  She also has CRPS.  Assessment and Plan: KThaois a 35y.o. female with: Asthma Diagnosed with asthma over 30+ years ago and was doing well up until 2 years ago - she may had Covid-19 then. Currently taking Advair 2540m 1 puff BID and needing albuterol daily. Had 2 courses of prednisone which helped but it maker her hyper which she doesn't like. Takes Protonix prn for GERD. Normal CXR in August 2022. Today's spirometry showed some restriction with 15% improvement in FEV1 post bronchodilator treatment. Clinically feeling slightly improved.  Declined oral prednisone due to side effects. Take mucinex twice a day with plenty of water to loosen up chest congestion. Daily controller medication(s): Trelegy 20049m1 puff once a day and rinse mouth after each use. Sample given. Demonstrated proper use.  This replaces Advair.  During upper respiratory infections/asthma flares:  Start budesonide 0.5mg52mbulizer twice a day for 1-2 weeks until your breathing symptoms return to baseline.  Pretreat with albuterol 2 puffs or albuterol nebulizer.  If you need to use your albuterol nebulizer machine back to back within 15-30 minutes with no relief then please go to the ER/urgent care for further evaluation.  May use albuterol rescue inhaler 2 puffs every 4 to 6 hours as needed for shortness of breath, chest tightness, coughing, and wheezing. May use albuterol rescue inhaler 2 puffs 5 to 15 minutes prior to strenuous physical activities. Monitor frequency of use.  Get spirometry at next visit. Checking bloodwork if patient meets criteria for biologics.  Other allergic rhinitis Rhinitis symptoms worse in the fall and spring. No prior skin testing. Takes zyrtec prn with good benefit. Declines skin prick testing today due to complex regional pain syndrome. Will get bloodwork instead. Start  Singulair (montelukast) 59m daily at night - patient tolerated in the past with no issues.  Cautioned that in  some children/adults can experience behavioral changes including hyperactivity, agitation, depression, sleep disturbances and suicidal ideations. These side effects are rare, but if you notice them you should notify me and discontinue Singulair (montelukast). Use over the counter antihistamines such as Zyrtec (cetirizine), Claritin (loratadine), Allegra (fexofenadine), or Xyzal (levocetirizine) daily as needed. May take twice a day during allergy flares. May switch antihistamines every few months.  History of anaphylaxis Patient had an allergic reaction in the past requiring ER visit and there was a concern whether lodine was the trigger as it was a new medication around this time. She tolerated ibuprofen in the past.  Continue to avoid lodine. Get bloodwork - patient is not aware of work up for the anaphylaxis in the past.  I have prescribed epinephrine injectable device and demonstrated proper use. For mild symptoms you can take over the counter antihistamines such as Benadryl and monitor symptoms closely. If symptoms worsen or if you have severe symptoms including breathing issues, throat closure, significant swelling, whole body hives, severe diarrhea and vomiting, lightheadedness then inject epinephrine and seek immediate medical care afterwards. Emergency action plan given.  History of urticaria Used to break out in the past with no known triggers. No recent outbreak. Keep track of outbreaks.  Gastroesophageal reflux disease Takes Protonix prn. See handout for lifestyle and dietary modifications. Take Protonix 475mdaily. No eating/drinking for 30 minutes afterwards. Stop smoking/vaping.   Return in about 2 months (around 11/18/2021).  Meds ordered this encounter  Medications   EPINEPHrine (AUVI-Q) 0.3 mg/0.3 mL IJ SOAJ injection    Sig: Inject 0.3 mg into the muscle as needed for anaphylaxis.    Dispense:  2 each    Refill:  1    C: [id number] budesonide (PULMICORT) 0.5 MG/2ML  nebulizer solution    Sig: Take 2 mLs (0.5 mg total) by nebulization in the morning and at bedtime. Take twice a day during upper respiratory infection for 1-2 weeks at a time.    Dispense:  60 mL    Refill:  2   montelukast (SINGULAIR) 10 MG tablet    Sig: Take 1 tablet (10 mg total) by mouth at bedtime.    Dispense:  30 tablet    Refill:  5   Fluticasone-Umeclidin-Vilant (TRELEGY ELLIPTA) 200-62.5-25 MCG/ACT AEPB    Sig: Inhale 1 puff into the lungs daily. Rinse mouth after each use.    Dispense:  60 each    Refill:  2    Lab Orders         Allergens w/Total IgE Area 2         CBC with Differential/Platelet         Tryptase         Comprehensive metabolic panel         Alpha-Gal Panel         ANA w/Reflex         Allergen Fire Ant         Allergen Hymenoptera Panel         Chronic Urticaria      Other allergy screening: Rhino conjunctivitis: yes Coughing, rhinorrhea, PND - worse in the fall and spring. No prior allergy testing.  Takes zyrtec prn with good benefit.   Food allergy: no Medication allergy: yes Hymenoptera allergy: no Urticaria: yes With no known triggers  Eczema:no History of recurrent infections suggestive  of immunodeficency: no  Diagnostics: Spirometry:  Tracings reviewed. Her effort: Good reproducible efforts. FVC: 2.97L FEV1: 2.38L, 76% predicted FEV1/FVC ratio: 80% Interpretation: Spirometry consistent with possible restrictive disease with 15% improvement in FEV1 post bronchodilator treatment. Clinically feeling improved.   Please see scanned spirometry results for details.  Skin Testing: None.  Past Medical History: Patient Active Problem List   Diagnosis Date Noted   Asthma 09/18/2021   History of urticaria 09/18/2021   History of anaphylaxis 09/18/2021   Other allergic rhinitis 09/18/2021   Diarrhea 05/24/2021   Change in bowel habits 05/24/2021   Irritable bowel syndrome 05/24/2021   Bright red blood per rectum 05/24/2021    Non-intractable vomiting 06/18/2020   RUQ pain 06/18/2020   Unintentional weight loss 06/18/2020   Abdominal bloating 06/18/2020   Medication management 06/18/2020   Encounter for induction of labor 08/17/2019   Gestational hypertension 08/17/2019   Perineal laceration, second degree 08/17/2019   Vitamin D deficiency 09/20/2018   Wrist tendonitis 09/20/2018   History of asthma 09/04/2017   Chronic gastric ulcer without hemorrhage and without perforation 06/11/2017   Gastroesophageal reflux disease 06/11/2017   Family history of esophageal cancer 06/11/2017   Complex regional pain syndrome type 1 06/11/2017   SVD (spontaneous vaginal delivery) 03/30/2017   Postpartum care following vaginal delivery 10/7 03/30/2017   Past Medical History:  Diagnosis Date   Allergy    Anxiety    Asthma    Complication of anesthesia    tends to experience "excessive hysteria" post op   Constipation    Constipation    CRPS (complex regional pain syndrome type I)    Sedillo, Dr. Fredric Mare   Depression    Family history of cancer    esophageal, father   Gastric ulcer    GERD (gastroesophageal reflux disease)    GERD (gastroesophageal reflux disease)    Panic disorder    Renal calculus    Wears glasses    Past Surgical History: Past Surgical History:  Procedure Laterality Date   ABLATION ON ENDOMETRIOSIS N/A 01/06/2013   Procedure: Robotic-Assisted Resection of Endometriosis;  Surgeon: Lovenia Sativa Gelles, MD;  Location: [facility];  Service: Gynecology;  Laterality: N/A;   ESOPHAGOGASTRODUODENOSCOPY     2 prior, last in 2016, most recent eval with [facility]   kidney stent placement     with later removal   LITHOTRIPSY     x 2   ROBOTIC ASSISTED LAPAROSCOPIC LYSIS OF ADHESION N/A 01/06/2013   Procedure: Robotic-Assisted Laparoscopic Lysis of Adhesions;  Surgeon: Lovenia Milus Fritze, MD;  Location: [facility];  Service: Gynecology;  Laterality: N/A;   TENDON RECONSTRUCTION  10/2015    right foot   Medication List:  Current Outpatient Medications  Medication Sig Dispense Refill   ALPRAZolam (XANAX) 0.5 MG tablet Take 0.5 mg by mouth at bedtime as needed for anxiety. As needed 1/2 tab     aluminum hydroxide-magnesium carbonate (GAVISCON) 95-358 MG/15ML SUSP Take by mouth.     budesonide (PULMICORT) 0.5 MG/2ML nebulizer solution Take 2 mLs (0.5 mg total) by nebulization in the morning and at bedtime. Take twice a day during upper respiratory infection for 1-2 weeks at a time. 60 mL 2   EPINEPHrine (AUVI-Q) 0.3 mg/0.3 mL IJ SOAJ injection Inject 0.3 mg into the muscle as needed for anaphylaxis. 2 each 1   famotidine (PEPCID) 20 MG tablet Take 20 mg by mouth 2 (two) times daily.     Fluticasone-Umeclidin-Vilant (TRELEGY ELLIPTA)  200-62.5-25 MCG/ACT AEPB Inhale 1 puff into the lungs daily. Rinse mouth after each use. 60 each 2   mirtazapine (REMERON) 15 MG tablet Take 15 mg by mouth at bedtime.     montelukast (SINGULAIR) 10 MG tablet Take 1 tablet (10 mg total) by mouth at bedtime. 30 tablet 5   pantoprazole (PROTONIX) 40 MG tablet 1 tablet daily po 45 min prior to breakfast 30 tablet 6   SYEDA 3-0.03 MG tablet Take 1 tablet by mouth daily.     VENTOLIN HFA 108 (90 Base) MCG/ACT inhaler TAKE 2 PUFFS BY MOUTH EVERY 6 HOURS AS NEEDED FOR WHEEZE OR SHORTNESS OF BREATH 18 Inhaler 0   albuterol (PROVENTIL) (2.5 MG/3ML) 0.083% nebulizer solution Take 2.5 mg by nebulization every 6 (six) hours.     hydrocortisone (ANUSOL-HC) 25 MG suppository Place 1 suppository (25 mg total) rectally at bedtime. QHS for 1 week then every other evening until Prescription is complete. (Patient not taking: Reported on 09/18/2021) 12 suppository 0   sucralfate (CARAFATE) 1 GM/10ML suspension Take 10 mLs (1 g total) by mouth 4 (four) times daily. (Patient not taking: Reported on 09/18/2021) 420 mL 1   Current Facility-Administered Medications  Medication Dose Route Frequency Provider Last Rate Last Admin    0.9 %  sodium chloride infusion  500 mL Intravenous Once Mansouraty, Telford Nab., MD       Allergies: Allergies  Allergen Reactions   Lodine [Etodolac] Anaphylaxis and Rash    Has taken ibuprofen in past with no problems, per patient   Cortisone Other (See Comments)    Questionable allergy?   Social History: Social History   Socioeconomic History   Marital status: Married    Spouse name: Not on file   Number of children: 1   Years of education: Not on file   Highest education level: Not on file  Occupational History    Employer: [employer]  Tobacco Use   Smoking status: Former    Types: Cigarettes    Quit date: 2017    Years since quitting: 5.8   Smokeless tobacco: Never   Tobacco comments:    prior social smoker, not prior regular smoker  Vaping Use   Vaping Use: Never used  Substance and Sexual Activity   Alcohol use: No    Comment: occasional   Drug use: Not Currently    Types: Marijuana    Comment: 2 times weekly    Sexual activity: Yes  Other Topics Concern   Not on file  Social History Narrative   Lives with husband and son.  Photographer, was model and actress prior.  Exercise - walks, goes to gym 2 days per week.   Diet - most home cooked, healthy.   09/2018   Social Determinants of Health   Financial Resource Strain: Not on file  Food Insecurity: Not on file  Transportation Needs: Not on file  Physical Activity: Not on file  Stress: Not on file  Social Connections: Not on file   Lives in a 36 year old house. 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 Carpet in the family room: no Carpet in the bedroom: yes Heating: electric Cooling: central Pet: yes 1 dog and a 1 cat  Family History: Family History  Problem Relation Age of Onset   Diabetes Paternal Grandfather    Diabetes Maternal Grandfather    COPD Maternal Grandfather    Esophageal cancer Father    GER disease Father  Barrett's esophagus Father    Hypertension Father    GER disease Mother    Depression Mother    Hyperlipidemia Mother    Ulcers Brother    Sleep apnea Brother    GER disease Brother    COPD Maternal Grandmother    Dementia Maternal Grandmother    GER disease Son    Other Son        anal stenosis   Heart disease Neg Hx    Stroke Neg Hx    Colon cancer Neg Hx    Stomach cancer Neg Hx    Colon polyps Neg Hx    Rectal cancer Neg Hx    Inflammatory bowel disease Neg Hx    Liver disease Neg Hx    Pancreatic cancer Neg Hx    Review of Systems  Constitutional:  Positive for chills. Negative for appetite change, fever and unexpected weight change.  HENT:  Positive for congestion. Negative for rhinorrhea.   Eyes:  Negative for itching.  Respiratory:  Positive for cough, chest tightness, shortness of breath and wheezing.   Cardiovascular:  Negative for chest pain.  Gastrointestinal:  Negative for abdominal pain.  Genitourinary:  Negative for difficulty urinating.  Skin:  Negative for rash.  Neurological:  Positive for headaches.   Objective: BP 104/72   Pulse 85   Temp 98.6 F (37 C) (Temporal)   Resp 18   Ht _0  (1.626 m)   Wt 152 lb 8 oz (69.2 kg)   SpO2 98%   BMI 26.18 kg/m  Body mass index is 26.18 kg/m. Physical Exam Vitals and nursing note reviewed.  Constitutional:      Appearance: Normal appearance. She is well-developed.  HENT:     Head: Normocephalic and atraumatic.     Right Ear: Tympanic membrane and external ear normal.     Left Ear: Tympanic membrane and external ear normal.     Nose: Congestion (on right side) present.     Mouth/Throat:     Mouth: Mucous membranes are moist.     Pharynx: Oropharynx is clear.  Eyes:     Conjunctiva/sclera: Conjunctivae normal.  Cardiovascular:     Rate and Rhythm: Normal rate and regular rhythm.     Heart sounds: Normal heart sounds. No murmur heard.   No friction rub. No gallop.  Pulmonary:     Effort: Pulmonary  effort is normal.     Breath sounds: Normal breath sounds. No wheezing, rhonchi or rales.  Musculoskeletal:     Cervical back: Neck supple.  Skin:    General: Skin is warm.     Findings: No rash.  Neurological:     Mental Status: She is alert and oriented to person, place, and time.  Psychiatric:        Behavior: Behavior normal.   The plan was reviewed with the patient/family, and all questions/concerned were addressed.  It was my pleasure to see Elvena today and participate in her care. Please feel free to contact me with any questions or concerns.  Sincerely,  Rexene Alberts, DO Allergy & Immunology  [facility] office: [place] office: [phone]

## 2021-09-18 NOTE — Assessment & Plan Note (Addendum)
Diagnosed with asthma over 30+ years ago and was doing well up until 2 years ago - she may had Covid-19 then. Currently taking Advair 247mcg 1 puff BID and needing albuterol daily. Had 2 courses of prednisone which helped but it maker her hyper which she doesn't like. Takes Protonix prn for GERD. Normal CXR in August 2022.  Today's spirometry showed some restriction with 15% improvement in FEV1 post bronchodilator treatment. Clinically feeling slightly improved.  . Declined oral prednisone due to side effects. . Take mucinex twice a day with plenty of water to loosen up chest congestion. . Daily controller medication(s): Trelegy 254mcg 1 puff once a day and rinse mouth after each use. Sample given. Demonstrated proper use.  o This replaces Advair.  . During upper respiratory infections/asthma flares:  o Start budesonide 0.5mg  nebulizer twice a day for 1-2 weeks until your breathing symptoms return to baseline.  o Pretreat with albuterol 2 puffs or albuterol nebulizer.  o If you need to use your albuterol nebulizer machine back to back within 15-30 minutes with no relief then please go to the ER/urgent care for further evaluation.  . May use albuterol rescue inhaler 2 puffs every 4 to 6 hours as needed for shortness of breath, chest tightness, coughing, and wheezing. May use albuterol rescue inhaler 2 puffs 5 to 15 minutes prior to strenuous physical activities. Monitor frequency of use.  . Get spirometry at next visit. . Checking bloodwork if patient meets criteria for biologics.

## 2021-09-18 NOTE — Assessment & Plan Note (Signed)
Patient had an allergic reaction in the past requiring ER visit and there was a concern whether lodine was the trigger as it was a new medication around this time. She tolerated ibuprofen in the past.  . Continue to avoid lodine. . Get bloodwork - patient is not aware of work up for the anaphylaxis in the past.  . I have prescribed epinephrine injectable device and demonstrated proper use. For mild symptoms you can take over the counter antihistamines such as Benadryl and monitor symptoms closely. If symptoms worsen or if you have severe symptoms including breathing issues, throat closure, significant swelling, whole body hives, severe diarrhea and vomiting, lightheadedness then inject epinephrine and seek immediate medical care afterwards. . Emergency action plan given.

## 2021-09-18 NOTE — Assessment & Plan Note (Signed)
Takes Protonix prn.  See handout for lifestyle and dietary modifications.  Take Protonix 40mg  daily. No eating/drinking for 30 minutes afterwards.  Stop smoking/vaping.

## 2021-09-18 NOTE — Assessment & Plan Note (Signed)
Used to break out in the past with no known triggers. No recent outbreak.  Keep track of outbreaks.

## 2021-09-18 NOTE — Patient Instructions (Addendum)
Asthma: Take mucinex twice a day with plenty of water.   Daily controller medication(s): Trelegy 268mcg 1 puff once a day and rinse mouth after each use. Sample given. Demonstrated proper use.  This replaces Advair.  During upper respiratory infections/asthma flares:  Start budesonide 0.5mg  nebulizer twice a day for 1-2 weeks until your breathing symptoms return to baseline.  Pretreat with albuterol 2 puffs or albuterol nebulizer.  If you need to use your albuterol nebulizer machine back to back within 15-30 minutes with no relief then please go to the ER/urgent care for further evaluation.   May use albuterol rescue inhaler 2 puffs every 4 to 6 hours as needed for shortness of breath, chest tightness, coughing, and wheezing. May use albuterol rescue inhaler 2 puffs 5 to 15 minutes prior to strenuous physical activities. Monitor frequency of use.  Asthma control goals:  Full participation in all desired activities (may need albuterol before activity) Albuterol use two times or less a week on average (not counting use with activity) Cough interfering with sleep two times or less a month Oral steroids no more than once a year No hospitalizations   Rhinitis: Start Singulair (montelukast) 10mg  daily at night. Cautioned that in some children/adults can experience behavioral changes including hyperactivity, agitation, depression, sleep disturbances and suicidal ideations. These side effects are rare, but if you notice them you should notify me and discontinue Singulair (montelukast).  Get bloodwork We are ordering labs, so please allow 1-2 weeks for the results to come back. With the newly implemented Cures Act, the labs might be visible to you at the same time that they become visible to me. However, I will not address the results until all of the results are back, so please be patient.  In the meantime, continue recommendations in your patient instructions, including avoidance measures (if  applicable), until you hear from me. Use over the counter antihistamines such as Zyrtec (cetirizine), Claritin (loratadine), Allegra (fexofenadine), or Xyzal (levocetirizine) daily as needed. May take twice a day during allergy flares. May switch antihistamines every few months.  Allergic reaction/hives Continue to avoid lodine. Get bloodwork. I have prescribed epinephrine injectable device and demonstrated proper use. For mild symptoms you can take over the counter antihistamines such as Benadryl and monitor symptoms closely. If symptoms worsen or if you have severe symptoms including breathing issues, throat closure, significant swelling, whole body hives, severe diarrhea and vomiting, lightheadedness then inject epinephrine and seek immediate medical care afterwards. Emergency action plan given.  Heartburn: See handout for lifestyle and dietary modifications. Take Protonix 40mg  daily. No eating/drinking for 30 minutes afterwards. Stop smoking/vaping.   Follow up in 2 months or sooner if needed.

## 2021-09-27 LAB — CBC WITH DIFFERENTIAL/PLATELET
Basophils Absolute: 0.1 10*3/uL (ref 0.0–0.2)
Basos: 1 %
EOS (ABSOLUTE): 0.1 10*3/uL (ref 0.0–0.4)
Eos: 2 %
Hematocrit: 40.1 % (ref 34.0–46.6)
Hemoglobin: 13.8 g/dL (ref 11.1–15.9)
Immature Grans (Abs): 0 10*3/uL (ref 0.0–0.1)
Immature Granulocytes: 0 %
Lymphocytes Absolute: 3.2 10*3/uL — ABNORMAL HIGH (ref 0.7–3.1)
Lymphs: 38 %
MCH: 29.7 pg (ref 26.6–33.0)
MCHC: 34.4 g/dL (ref 31.5–35.7)
MCV: 86 fL (ref 79–97)
Monocytes Absolute: 0.4 10*3/uL (ref 0.1–0.9)
Monocytes: 4 %
Neutrophils Absolute: 4.7 10*3/uL (ref 1.4–7.0)
Neutrophils: 55 %
Platelets: 330 10*3/uL (ref 150–450)
RBC: 4.64 x10E6/uL (ref 3.77–5.28)
RDW: 12.2 % (ref 11.7–15.4)
WBC: 8.5 10*3/uL (ref 3.4–10.8)

## 2021-09-27 LAB — COMPREHENSIVE METABOLIC PANEL
ALT: 6 IU/L (ref 0–32)
AST: 9 IU/L (ref 0–40)
Albumin/Globulin Ratio: 2.3 — ABNORMAL HIGH (ref 1.2–2.2)
Albumin: 4.9 g/dL — ABNORMAL HIGH (ref 3.8–4.8)
Alkaline Phosphatase: 74 IU/L (ref 44–121)
BUN/Creatinine Ratio: 14 (ref 9–23)
BUN: 10 mg/dL (ref 6–20)
Bilirubin Total: <0.2 mg/dL (ref 0.0–1.2)
CO2: 23 mmol/L (ref 20–29)
Calcium: 9.4 mg/dL (ref 8.7–10.2)
Chloride: 105 mmol/L (ref 96–106)
Creatinine, Ser: 0.73 mg/dL (ref 0.57–1.00)
Globulin, Total: 2.1 g/dL (ref 1.5–4.5)
Glucose: 88 mg/dL (ref 70–99)
Potassium: 4 mmol/L (ref 3.5–5.2)
Sodium: 140 mmol/L (ref 134–144)
Total Protein: 7 g/dL (ref 6.0–8.5)
eGFR: 110 mL/min/{1.73_m2} (ref >59)

## 2021-09-27 LAB — ALLERGEN HYMENOPTERA PANEL
Bumblebee: <0.1 kU/L
Honeybee IgE: <0.1 kU/L
Hornet, White Face, IgE: <0.1 kU/L
Hornet, Yellow, IgE: <0.1 kU/L
Paper Wasp IgE: <0.1 kU/L
Yellow Jacket, IgE: <0.1 kU/L

## 2021-09-27 LAB — ALLERGENS W/TOTAL IGE AREA 2

## 2021-09-27 LAB — TRYPTASE: Tryptase: 7 ug/L (ref 2.2–13.2)

## 2021-09-27 LAB — ALPHA-GAL PANEL
Allergen Lamb IgE: <0.1 kU/L
Beef IgE: <0.1 kU/L
IgE (Immunoglobulin E), Serum: 11 IU/mL (ref 6–495)
O215-IgE Alpha-Gal: <0.1 kU/L
Pork IgE: <0.1 kU/L

## 2021-09-27 LAB — CHRONIC URTICARIA: cu index: 5.2 (ref <10)

## 2021-09-27 LAB — ALLERGEN FIRE ANT: I070-IgE Fire Ant (Invicta): <0.1 kU/L

## 2021-09-27 LAB — ANA W/REFLEX: Anti Nuclear Antibody (ANA): NEGATIVE

## 2021-12-08 NOTE — Progress Notes (Deleted)
Follow Up Note  RE: Amber Pope MRN: [id number] DOB: [birth year] Date of Office Visit: 12/09/2021  Referring provider: Aretta Nip, MD Primary care provider: Aretta Nip, MD  Chief Complaint: No chief complaint on file.  History of Present Illness: I had the pleasure of seeing Amber Pope for a follow up visit at the [facility] on 12/08/2021. She is a 36 y.o. female, who is being followed for asthma, allergic rhinitis, history of anaphylaxis, history of urticaria and GERD. Her previous allergy office visit was on 09/18/2021 with Dr. Maudie Mercury. Today is a regular follow up visit.  Asthma Diagnosed with asthma over 30+ years ago and was doing well up until 2 years ago - she may had Covid-19 then. Currently taking Advair 244mcg 1 puff BID and needing albuterol daily. Had 2 courses of prednisone which helped but it maker her hyper which she doesn't like. Takes Protonix prn for GERD. Normal CXR in August 2022. Today's spirometry showed some restriction with 15% improvement in FEV1 post bronchodilator treatment. Clinically feeling slightly improved.  Declined oral prednisone due to side effects. Take mucinex twice a day with plenty of water to loosen up chest congestion. Daily controller medication(s): Trelegy 264mcg 1 puff once a day and rinse mouth after each use. Sample given. Demonstrated proper use.  This replaces Advair.  During upper respiratory infections/asthma flares:  Start budesonide 0.5mg  nebulizer twice a day for 1-2 weeks until your breathing symptoms return to baseline.  Pretreat with albuterol 2 puffs or albuterol nebulizer.  If you need to use your albuterol nebulizer machine back to back within 15-30 minutes with no relief then please go to the ER/urgent care for further evaluation.  May use albuterol rescue inhaler 2 puffs every 4 to 6 hours as needed for shortness of breath, chest tightness, coughing, and wheezing. May use albuterol rescue  inhaler 2 puffs 5 to 15 minutes prior to strenuous physical activities. Monitor frequency of use.  Get spirometry at next visit. Checking bloodwork if patient meets criteria for biologics.   Other allergic rhinitis Rhinitis symptoms worse in the fall and spring. No prior skin testing. Takes zyrtec prn with good benefit. Declines skin prick testing today due to complex regional pain syndrome. Will get bloodwork instead. Start Singulair (montelukast) 10mg  daily at night - patient tolerated in the past with no issues.  Cautioned that in some children/adults can experience behavioral changes including hyperactivity, agitation, depression, sleep disturbances and suicidal ideations. These side effects are rare, but if you notice them you should notify me and discontinue Singulair (montelukast). Use over the counter antihistamines such as Zyrtec (cetirizine), Claritin (loratadine), Allegra (fexofenadine), or Xyzal (levocetirizine) daily as needed. May take twice a day during allergy flares. May switch antihistamines every few months.   History of anaphylaxis Patient had an allergic reaction in the past requiring ER visit and there was a concern whether lodine was the trigger as it was a new medication around this time. She tolerated ibuprofen in the past.  Continue to avoid lodine. Get bloodwork - patient is not aware of work up for the anaphylaxis in the past.  I have prescribed epinephrine injectable device and demonstrated proper use. For mild symptoms you can take over the counter antihistamines such as Benadryl and monitor symptoms closely. If symptoms worsen or if you have severe symptoms including breathing issues, throat closure, significant swelling, whole body hives, severe diarrhea and vomiting, lightheadedness then inject epinephrine and seek immediate medical care  afterwards. Emergency action plan given.   History of urticaria Used to break out in the past with no known triggers. No recent  outbreak. Keep track of outbreaks.   Gastroesophageal reflux disease Takes Protonix prn. See handout for lifestyle and dietary modifications. Take Protonix 40mg  daily. No eating/drinking for 30 minutes afterwards. Stop smoking/vaping.    Return in about 2 months (around 11/18/2021).  I reviewed the bloodwork. Blood count, kidney function, liver function, electrolytes, thyroid, autoimmune screener, chronic urticaria index (checks for autoantibodies that trigger mast cells), tryptase (checks for mast cell issues) and alpha gal (checks for red meat allergy) were all normal which is great.    No indoor/outdoor allergies. No allergies to stinging insects.   No triggers found for your allergic reaction and hives based on the above bloodwork. Keep track of episodes.    Bloodwork did not show any elevated eosinophils or IgE level that would qualify you for certain injectables for asthma. There is one that may be covered called Tezspire - we can discuss more in detail at next visit.    Assessment and Plan: Amber Pope is a 36 y.o. female with: No problem-specific Assessment & Plan notes found for this encounter.  No follow-ups on file.  No orders of the defined types were placed in this encounter.  Lab Orders  No laboratory test(s) ordered today    Diagnostics: Spirometry:  Tracings reviewed. Her effort: {Blank single:19197::"Good reproducible efforts.","It was hard to get consistent efforts and there is a question as to whether this reflects a maximal maneuver.","Poor effort, data can not be interpreted."} FVC: ***L FEV1: ***L, ***% predicted FEV1/FVC ratio: ***% Interpretation: {Blank single:19197::"Spirometry consistent with mild obstructive disease","Spirometry consistent with moderate obstructive disease","Spirometry consistent with severe obstructive disease","Spirometry consistent with possible restrictive disease","Spirometry consistent with mixed obstructive and restrictive  disease","Spirometry uninterpretable due to technique","Spirometry consistent with normal pattern","No overt abnormalities noted given today's efforts"}.  Please see scanned spirometry results for details.  Skin Testing: {Blank single:19197::"Select foods","Environmental allergy panel","Environmental allergy panel and select foods","Food allergy panel","None","Deferred due to recent antihistamines use"}. *** Results discussed with patient/family.   Medication List:  Current Outpatient Medications  Medication Sig Dispense Refill   albuterol (PROVENTIL) (2.5 MG/3ML) 0.083% nebulizer solution Take 2.5 mg by nebulization every 6 (six) hours.     ALPRAZolam (XANAX) 0.5 MG tablet Take 0.5 mg by mouth at bedtime as needed for anxiety. As needed 1/2 tab     aluminum hydroxide-magnesium carbonate (GAVISCON) 95-358 MG/15ML SUSP Take by mouth.     budesonide (PULMICORT) 0.5 MG/2ML nebulizer solution Take 2 mLs (0.5 mg total) by nebulization in the morning and at bedtime. Take twice a day during upper respiratory infection for 1-2 weeks at a time. 60 mL 2   EPINEPHrine (AUVI-Q) 0.3 mg/0.3 mL IJ SOAJ injection Inject 0.3 mg into the muscle as needed for anaphylaxis. 2 each 1   famotidine (PEPCID) 20 MG tablet Take 20 mg by mouth 2 (two) times daily.     Fluticasone-Umeclidin-Vilant (TRELEGY ELLIPTA) 200-62.5-25 MCG/ACT AEPB Inhale 1 puff into the lungs daily. Rinse mouth after each use. 60 each 2   hydrocortisone (ANUSOL-HC) 25 MG suppository Place 1 suppository (25 mg total) rectally at bedtime. QHS for 1 week then every other evening until Prescription is complete. (Patient not taking: Reported on 09/18/2021) 12 suppository 0   mirtazapine (REMERON) 15 MG tablet Take 15 mg by mouth at bedtime.     montelukast (SINGULAIR) 10 MG tablet Take 1 tablet (10 mg total) by mouth  at bedtime. 30 tablet 5   pantoprazole (PROTONIX) 40 MG tablet 1 tablet daily po 45 Amber prior to breakfast 30 tablet 6    sucralfate (CARAFATE) 1 GM/10ML suspension Take 10 mLs (1 g total) by mouth 4 (four) times daily. (Patient not taking: Reported on 09/18/2021) 420 mL 1   SYEDA 3-0.03 MG tablet Take 1 tablet by mouth daily.     VENTOLIN HFA 108 (90 Base) MCG/ACT inhaler TAKE 2 PUFFS BY MOUTH EVERY 6 HOURS AS NEEDED FOR WHEEZE OR SHORTNESS OF BREATH 18 Inhaler 0   Current Facility-Administered Medications  Medication Dose Route Frequency Provider Last Rate Last Admin   0.9 %  sodium chloride infusion  500 mL Intravenous Once Mansouraty, Telford Nab., MD       Allergies: Allergies  Allergen Reactions   Lodine [Etodolac] Anaphylaxis and Rash    Has taken ibuprofen in past with no problems, per patient   Cortisone Other (See Comments)    Questionable allergy?   I reviewed her past medical history, social history, family history, and environmental history and no significant changes have been reported from her previous visit.  Review of Systems  Constitutional:  Positive for chills. Negative for appetite change, fever and unexpected weight change.  HENT:  Positive for congestion. Negative for rhinorrhea.   Eyes:  Negative for itching.  Respiratory:  Positive for cough, chest tightness, shortness of breath and wheezing.   Cardiovascular:  Negative for chest pain.  Gastrointestinal:  Negative for abdominal pain.  Genitourinary:  Negative for difficulty urinating.  Skin:  Negative for rash.  Neurological:  Positive for headaches.   Objective: There were no vitals taken for this visit. There is no height or weight on file to calculate BMI. Physical Exam Vitals and nursing note reviewed.  Constitutional:      Appearance: Normal appearance. She is well-developed.  HENT:     Head: Normocephalic and atraumatic.     Right Ear: Tympanic membrane and external ear normal.     Left Ear: Tympanic membrane and external ear normal.     Nose: Congestion (on right side) present.     Mouth/Throat:     Mouth:  Mucous membranes are moist.     Pharynx: Oropharynx is clear.  Eyes:     Conjunctiva/sclera: Conjunctivae normal.  Cardiovascular:     Rate and Rhythm: Normal rate and regular rhythm.     Heart sounds: Normal heart sounds. No murmur heard.   No friction rub. No gallop.  Pulmonary:     Effort: Pulmonary effort is normal.     Breath sounds: Normal breath sounds. No wheezing, rhonchi or rales.  Musculoskeletal:     Cervical back: Neck supple.  Skin:    General: Skin is warm.     Findings: No rash.  Neurological:     Mental Status: She is alert and oriented to person, place, and time.  Psychiatric:        Behavior: Behavior normal.  Previous notes and tests were reviewed. The plan was reviewed with the patient/family, and all questions/concerned were addressed.  It was my pleasure to see Amber Pope today and participate in her care. Please feel free to contact me with any questions or concerns.  Sincerely,  Rexene Alberts, DO Allergy & Immunology  [facility] office: [place] office: [phone]

## 2021-12-09 ENCOUNTER — Ambulatory Visit: Payer: Managed Care, Other (non HMO) | Admitting: Allergy

## 2021-12-09 DIAGNOSIS — J4540 Moderate persistent asthma, uncomplicated: Secondary | ICD-10-CM

## 2021-12-09 DIAGNOSIS — Z87892 Personal history of anaphylaxis: Secondary | ICD-10-CM

## 2021-12-09 DIAGNOSIS — J310 Chronic rhinitis: Secondary | ICD-10-CM

## 2021-12-09 DIAGNOSIS — K219 Gastro-esophageal reflux disease without esophagitis: Secondary | ICD-10-CM

## 2021-12-09 DIAGNOSIS — Z872 Personal history of diseases of the skin and subcutaneous tissue: Secondary | ICD-10-CM

## 2021-12-20 ENCOUNTER — Other Ambulatory Visit: Payer: Self-pay | Admitting: Allergy

## 2021-12-20 NOTE — Patient Instructions (Incomplete)
Asthma Daily controller medication(s): Trelegy 227mcg 1 puff once a day and rinse mouth after each use.   During upper respiratory infections/asthma flares:  Start budesonide 0.5mg  nebulizer twice a day for 1-2 weeks until your breathing symptoms return to baseline.  Pretreat with albuterol 2 puffs or albuterol nebulizer.  If you need to use your albuterol nebulizer machine back to back within 15-30 minutes with no relief then please go to the ER/urgent care for further evaluation.  May use albuterol rescue inhaler 2 puffs every 4 to 6 hours as needed for shortness of breath, chest tightness, coughing, and wheezing. May use albuterol rescue inhaler 2 puffs 5 to 15 minutes prior to strenuous physical activities. Monitor frequency of use.  Asthma control goals:  Full participation in all desired activities (may need albuterol before activity) Albuterol use two times or less a week on average (not counting use with activity) Cough interfering with sleep two times or less a month Oral steroids no more than once a year No hospitalizations   Non allergic rhinitis:(lab work to environmental allergens negative) Continue Singulair (montelukast) 10mg  daily at night. Use over the counter antihistamines such as Zyrtec (cetirizine), Claritin (loratadine), Allegra (fexofenadine), or Xyzal (levocetirizine) daily as needed. May take twice a day during allergy flares. May switch antihistamines every few months.  Allergic reaction/hives Continue to avoid lodine. At your last office visit you were prescribed epinephrine injectable device and demonstrated proper use. For mild symptoms you can take over the counter antihistamines such as Benadryl and monitor symptoms closely. If symptoms worsen or if you have severe symptoms including breathing issues, throat closure, significant swelling, whole body hives, severe diarrhea and vomiting, lightheadedness then inject epinephrine and seek immediate medical care  afterwards. Emergency action plan given.  Heartburn: Continue lifestyle and dietary modifications. Continue  Protonix 40mg  daily. No eating/drinking for 30 minutes afterwards. Stop smoking/vaping.   Follow up in  months or sooner if needed.

## 2021-12-23 ENCOUNTER — Ambulatory Visit: Payer: Managed Care, Other (non HMO) | Admitting: Family

## 2021-12-23 DIAGNOSIS — J309 Allergic rhinitis, unspecified: Secondary | ICD-10-CM

## 2022-01-14 ENCOUNTER — Emergency Department (HOSPITAL_BASED_OUTPATIENT_CLINIC_OR_DEPARTMENT_OTHER): Payer: Managed Care, Other (non HMO)

## 2022-01-14 ENCOUNTER — Emergency Department (HOSPITAL_BASED_OUTPATIENT_CLINIC_OR_DEPARTMENT_OTHER)
Admission: EM | Admit: 2022-01-14 | Discharge: 2022-01-15 | Disposition: A | Discharge status: Home / Self Care | Payer: Managed Care, Other (non HMO) | Attending: Emergency Medicine | Admitting: Emergency Medicine

## 2022-01-14 ENCOUNTER — Other Ambulatory Visit: Payer: Self-pay

## 2022-01-14 ENCOUNTER — Encounter (HOSPITAL_BASED_OUTPATIENT_CLINIC_OR_DEPARTMENT_OTHER): Payer: Self-pay | Admitting: Emergency Medicine

## 2022-01-14 DIAGNOSIS — R7309 Other abnormal glucose: Secondary | ICD-10-CM | POA: Diagnosis not present

## 2022-01-14 DIAGNOSIS — D72829 Elevated white blood cell count, unspecified: Secondary | ICD-10-CM | POA: Insufficient documentation

## 2022-01-14 DIAGNOSIS — R1032 Left lower quadrant pain: Secondary | ICD-10-CM | POA: Insufficient documentation

## 2022-01-14 DIAGNOSIS — R112 Nausea with vomiting, unspecified: Secondary | ICD-10-CM | POA: Diagnosis not present

## 2022-01-14 DIAGNOSIS — J45909 Unspecified asthma, uncomplicated: Secondary | ICD-10-CM | POA: Diagnosis not present

## 2022-01-14 DIAGNOSIS — Z20822 Contact with and (suspected) exposure to covid-19: Secondary | ICD-10-CM | POA: Insufficient documentation

## 2022-01-14 DIAGNOSIS — Z7951 Long term (current) use of inhaled steroids: Secondary | ICD-10-CM | POA: Diagnosis not present

## 2022-01-14 DIAGNOSIS — R197 Diarrhea, unspecified: Secondary | ICD-10-CM | POA: Insufficient documentation

## 2022-01-14 DIAGNOSIS — R1030 Lower abdominal pain, unspecified: Secondary | ICD-10-CM

## 2022-01-14 LAB — CBC
HCT: 41.3 % (ref 36.0–46.0)
Hemoglobin: 14.3 g/dL (ref 12.0–15.0)
MCH: 30 pg (ref 26.0–34.0)
MCHC: 34.6 g/dL (ref 30.0–36.0)
MCV: 86.6 fL (ref 80.0–100.0)
Platelets: 349 10*3/uL (ref 150–400)
RBC: 4.77 MIL/uL (ref 3.87–5.11)
RDW: 12.8 % (ref 11.5–15.5)
WBC: 25.2 10*3/uL — ABNORMAL HIGH (ref 4.0–10.5)
nRBC: 0 % (ref 0.0–0.2)

## 2022-01-14 LAB — URINALYSIS, ROUTINE W REFLEX MICROSCOPIC
Bilirubin Urine: NEGATIVE
Glucose, UA: NEGATIVE mg/dL
Ketones, ur: 40 mg/dL — AB
Leukocytes,Ua: NEGATIVE
Nitrite: NEGATIVE
Specific Gravity, Urine: >1.046 — ABNORMAL HIGH (ref 1.005–1.030)
pH: 6 (ref 5.0–8.0)

## 2022-01-14 LAB — COMPREHENSIVE METABOLIC PANEL
ALT: 6 U/L (ref 0–44)
AST: 14 U/L — ABNORMAL LOW (ref 15–41)
Albumin: 5.1 g/dL — ABNORMAL HIGH (ref 3.5–5.0)
Alkaline Phosphatase: 72 U/L (ref 38–126)
Anion gap: 13 (ref 5–15)
BUN: 16 mg/dL (ref 6–20)
CO2: 17 mmol/L — ABNORMAL LOW (ref 22–32)
Calcium: 9.9 mg/dL (ref 8.9–10.3)
Chloride: 107 mmol/L (ref 98–111)
Creatinine, Ser: 1 mg/dL (ref 0.44–1.00)
GFR, Estimated: >60 mL/min (ref >60)
Glucose, Bld: 192 mg/dL — ABNORMAL HIGH (ref 70–99)
Potassium: 3.6 mmol/L (ref 3.5–5.1)
Sodium: 137 mmol/L (ref 135–145)
Total Bilirubin: 0.5 mg/dL (ref 0.3–1.2)
Total Protein: 8.2 g/dL — ABNORMAL HIGH (ref 6.5–8.1)

## 2022-01-14 LAB — WET PREP, GENITAL
Clue Cells Wet Prep HPF POC: NONE SEEN
Sperm: NONE SEEN
Trich, Wet Prep: NONE SEEN
WBC, Wet Prep HPF POC: <10 (ref <10)
Yeast Wet Prep HPF POC: NONE SEEN

## 2022-01-14 LAB — HCG, SERUM, QUALITATIVE: Preg, Serum: NEGATIVE

## 2022-01-14 LAB — RESP PANEL BY RT-PCR (FLU A&B, COVID) ARPGX2
Influenza A by PCR: NEGATIVE
Influenza B by PCR: NEGATIVE
SARS Coronavirus 2 by RT PCR: NEGATIVE

## 2022-01-14 LAB — LIPASE, BLOOD: Lipase: 13 U/L (ref 11–51)

## 2022-01-14 MED ORDER — FAMOTIDINE IN NACL 20-0.9 MG/50ML-% IV SOLN
20.0000 mg | Freq: Once | INTRAVENOUS | Status: AC
  Administered 2022-01-14: 20 mg via INTRAVENOUS
  Filled 2022-01-14: qty 50

## 2022-01-14 MED ORDER — ONDANSETRON HCL 4 MG/2ML IJ SOLN
4.0000 mg | Freq: Once | INTRAMUSCULAR | Status: AC | PRN
  Administered 2022-01-14: 4 mg via INTRAVENOUS

## 2022-01-14 MED ORDER — IOHEXOL 350 MG/ML SOLN
100.0000 mL | Freq: Once | INTRAVENOUS | Status: AC | PRN
  Administered 2022-01-14: 85 mL via INTRAVENOUS

## 2022-01-14 MED ORDER — SODIUM CHLORIDE 0.9 % IV BOLUS
1000.0000 mL | Freq: Once | INTRAVENOUS | Status: AC
Start: 2022-01-14 — End: 2022-01-14
  Administered 2022-01-14: 1000 mL via INTRAVENOUS

## 2022-01-14 MED ORDER — ONDANSETRON HCL 4 MG/2ML IJ SOLN
INTRAMUSCULAR | Status: AC
  Filled 2022-01-14: qty 2

## 2022-01-14 MED ORDER — ONDANSETRON 4 MG PO TBDP: 4.0000 mg | ORAL_TABLET | ORAL | 0 refills | Status: DC | PRN

## 2022-01-14 NOTE — Discharge Instructions (Signed)
1.  See your doctor for recheck within the next 2 to 3 days. ?2.  Take Zofran if needed for nausea. ?3.  Return to the emergency department if you have new worsening or concerning symptoms. ?

## 2022-01-14 NOTE — ED Triage Notes (Signed)
Patient unable to get out of restroom due to continued diarrhea and emesis. RN attempting to triage patient while in bathroom. Patient laying on floor dry heaving into bag. C/o N/V/D onset of today. When asked about sick contacts pt reports "my kids are always sick." Blood clots noted in phlegm pt is coughing up however pt is violently attempting to throw up. Reports minor relief from zofran from ems.  ?

## 2022-01-14 NOTE — ED Provider Notes (Signed)
[facility] DEPT Provider Note   CSN: [id number] Arrival date & time: 01/14/22  1400     History  Chief Complaint  Patient presents with   Emesis   Diarrhea    Amber Pope is a 36 y.o. female who presents to the emergency department complaining of emesis (dark brown) and nonbloody diarrhea onset today.  Has sick contacts of her children with similar symptoms.  Patient notes that she has had burning to her esophagus.  She was evaluated last year due to similar symptoms and had a negative HIDA scan work-up.  She is followed by GI.  Has associated nausea, dysuria.  Has not tried any medications for her symptoms.  Denies fever, cough, sore throat, rhinorrhea, nasal congestion.  Patient notes that her last menstrual period started 1.5 weeks ago and today she has had some bleeding with vaginal tissues noted.  She took a Plan B last week and started OCP 2-3 days later. Had to have kidney stones removed in the past. Had to have kidney stent placement in the past in 2009 that was removed.    The history is provided by the patient. No language interpreter was used.      Home Medications Prior to Admission medications   Medication Sig Start Date End Date Taking? Authorizing Provider  albuterol (PROVENTIL) (2.5 MG/3ML) 0.083% nebulizer solution Take 2.5 mg by nebulization every 6 (six) hours. 06/17/21   [provider]  ALPRAZolam Duanne Moron) 0.5 MG tablet Take 0.5 mg by mouth at bedtime as needed for anxiety. As needed 1/2 tab    [provider]  aluminum hydroxide-magnesium carbonate (GAVISCON) 95-358 MG/15ML SUSP Take by mouth.    [provider]  budesonide (PULMICORT) 0.5 MG/2ML nebulizer solution TAKE 2 MLS (0.5 MG TOTAL) BY NEBULIZATION IN THE MORNING AND AT BEDTIME. TAKE TWICE A DAY DURING UPPER RESPIRATORY INFECTION FOR 1-2 WEEKS AT A TIME. 12/20/21   Garnet Sierras, DO  EPINEPHrine (AUVI-Q) 0.3 mg/0.3 mL IJ SOAJ injection Inject 0.3 mg into the  muscle as needed for anaphylaxis. 09/18/21   Garnet Sierras, DO  famotidine (PEPCID) 20 MG tablet Take 20 mg by mouth 2 (two) times daily.    [provider]  Fluticasone-Umeclidin-Vilant (TRELEGY ELLIPTA) 200-62.5-25 MCG/ACT AEPB Inhale 1 puff into the lungs daily. Rinse mouth after each use. 09/18/21   Garnet Sierras, DO  hydrocortisone (ANUSOL-HC) 25 MG suppository Place 1 suppository (25 mg total) rectally at bedtime. QHS for 1 week then every other evening until Prescription is complete. Patient not taking: Reported on 09/18/2021 05/25/21   Mansouraty, Telford Nab., MD  mirtazapine (REMERON) 15 MG tablet Take 15 mg by mouth at bedtime. 06/03/20   [provider]  montelukast (SINGULAIR) 10 MG tablet Take 1 tablet (10 mg total) by mouth at bedtime. 09/18/21   Garnet Sierras, DO  pantoprazole (PROTONIX) 40 MG tablet 1 tablet daily po 45 min prior to breakfast 01/11/20   Esterwood, Amy S, PA-C  sucralfate (CARAFATE) 1 GM/10ML suspension Take 10 mLs (1 g total) by mouth 4 (four) times daily. Patient not taking: Reported on 09/18/2021 05/29/20   Mansouraty, Telford Nab., MD  SYEDA 3-0.03 MG tablet Take 1 tablet by mouth daily. 06/10/20   [provider]  VENTOLIN HFA 108 (90 Base) MCG/ACT inhaler TAKE 2 PUFFS BY MOUTH EVERY 6 HOURS AS NEEDED FOR WHEEZE OR SHORTNESS OF BREATH 12/06/18   Henson, Vickie L, PA-C      Allergies  Lodine [etodolac] and Cortisone    Review of Systems   Review of Systems  Constitutional:  Positive for chills. Negative for fever.  HENT:  Negative for congestion, rhinorrhea, sore throat and trouble swallowing.   Respiratory:  Negative for cough.   Gastrointestinal:  Positive for diarrhea and vomiting.  Genitourinary:  Negative for dysuria and hematuria.  Musculoskeletal:  Negative for back pain.  Skin:  Negative for rash.  All other systems reviewed and are negative.  Physical Exam Updated Vital Signs BP (!) 143/73    Pulse 76    Temp 97.6 F (36.4 C)     Resp 17    LMP 01/14/2022    SpO2 99%  Physical Exam Vitals and nursing note reviewed. Exam conducted with a chaperone present.  Constitutional:      General: She is not in acute distress.    Appearance: She is not diaphoretic.  HENT:     Head: Normocephalic and atraumatic.     Mouth/Throat:     Pharynx: No oropharyngeal exudate.  Eyes:     General: No scleral icterus.    Conjunctiva/sclera: Conjunctivae normal.  Cardiovascular:     Rate and Rhythm: Normal rate and regular rhythm.     Pulses: Normal pulses.     Heart sounds: Normal heart sounds.  Pulmonary:     Effort: Pulmonary effort is normal. No respiratory distress.     Breath sounds: Normal breath sounds. No wheezing.  Abdominal:     General: Bowel sounds are normal.     Palpations: Abdomen is soft. There is no mass.     Tenderness: There is abdominal tenderness in the epigastric area, suprapubic area and left lower quadrant. There is no right CVA tenderness, left CVA tenderness, guarding or rebound. Negative signs include Murphy's sign and McBurney's sign.     Comments: Tenderness to palpation noted to suprapubic, left lower quadrant, epigastric region.  No overlying skin changes.  Negative CVA tenderness bilaterally.  Genitourinary:    Labia:        Right: No rash, tenderness or lesion.        Left: No rash, tenderness or lesion.      Vagina: No foreign body. Bleeding present. No tenderness.     Cervix: Discharge (Yellow discharge noted from cervical os) and cervical bleeding present. No cervical motion tenderness or friability.     Adnexa: Right adnexa normal and left adnexa normal.     Comments: Nurse tech chaperone present for exam.  Musculoskeletal:        General: Normal range of motion.     Cervical back: Normal range of motion and neck supple.  Skin:    General: Skin is warm and dry.  Neurological:     Mental Status: She is alert.  Psychiatric:        Behavior: Behavior normal.    ED Results / Procedures /  Treatments   Labs (all labs ordered are listed, but only abnormal results are displayed) Labs Reviewed  COMPREHENSIVE METABOLIC PANEL - Abnormal; Notable for the following components:      Result Value   CO2 17 (*)    Glucose, Bld 192 (*)    Total Protein 8.2 (*)    Albumin 5.1 (*)    AST 14 (*)    All other components within normal limits  CBC - Abnormal; Notable for the following components:   WBC 25.2 (*)    All other components within normal limits  URINALYSIS, ROUTINE W REFLEX  MICROSCOPIC - Abnormal; Notable for the following components:   Specific Gravity, Urine >1.046 (*)    Hgb urine dipstick MODERATE (*)    Ketones, ur 40 (*)    Protein, ur TRACE (*)    All other components within normal limits  RESP PANEL BY RT-PCR (FLU A&B, COVID) ARPGX2  WET PREP, GENITAL  LIPASE, BLOOD  HCG, SERUM, QUALITATIVE  GC/CHLAMYDIA PROBE AMP ([place]) NOT AT [facility]    EKG EKG Interpretation  Date/Time:  Tuesday January 14 2022 18:11:35 EST Ventricular Rate:  53 PR Interval:  59 QRS Duration: 108 QT Interval:  512 QTC Calculation: 481 R Axis:   74 Text Interpretation: Sinus rhythm Short PR interval Consider right atrial enlargement Abnormal R-wave progression, early transition no ischemic appearance, normal no old comparison Confirmed by Charlesetta Shanks [phone]) on 01/14/2022 7:33:58 PM  Radiology CT ABDOMEN PELVIS W CONTRAST  Result Date: 01/14/2022 CLINICAL DATA:  Nausea, vomiting, diarrhea and fever. EXAM: CT ABDOMEN AND PELVIS WITH CONTRAST TECHNIQUE: Multidetector CT imaging of the abdomen and pelvis was performed using the standard protocol following bolus administration of intravenous contrast. RADIATION DOSE REDUCTION: This exam was performed according to the departmental dose-optimization program which includes automated exposure control, adjustment of the mA and/or kV according to patient size and/or use of iterative reconstruction technique. CONTRAST:  4m OMNIPAQUE IOHEXOL 350  MG/ML SOLN COMPARISON:  February 27, 2020 FINDINGS: Lower chest: No acute abnormality. Hepatobiliary: No focal liver abnormality is seen. No gallstones, gallbladder wall thickening, or biliary dilatation. Pancreas: Unremarkable. No pancreatic ductal dilatation or surrounding inflammatory changes. Spleen: Normal in size without focal abnormality. Adrenals/Urinary Tract: Adrenal glands are unremarkable. Kidneys are normal, without obstructing renal calculi, focal lesion, or hydronephrosis. A 1.2 cm nonobstructing renal calculus is seen within the lower pole of the right kidney. Bladder is unremarkable. Stomach/Bowel: There is a small hiatal hernia. Appendix appears normal. No evidence of bowel wall thickening, distention, or inflammatory changes. Vascular/Lymphatic: No significant vascular findings are present. No enlarged abdominal or pelvic lymph nodes. Reproductive: A stable 15 mm x 11 mm focus of low attenuation is seen within the lateral aspect of the body of the uterus on the right. The bilateral adnexa are unremarkable. Other: No abdominal wall hernia or abnormality. No abdominopelvic ascites. Musculoskeletal: No acute or significant osseous findings. IMPRESSION: 1. 1.2 cm nonobstructing right renal calculus. 2. Small hiatal hernia. 3. Small uterine fibroid. Electronically Signed   By: TVirgina NorfolkM.D.   On: 01/14/2022 21:15   [place] COMPLETE W TRANSVAGINAL AND TORSION R/O  Result Date: 01/14/2022 CLINICAL DATA:  Initial evaluation for acute lower abdominal pain. EXAM: TRANSABDOMINAL AND TRANSVAGINAL ULTRASOUND OF PELVIS DOPPLER ULTRASOUND OF OVARIES TECHNIQUE: Both transabdominal and transvaginal ultrasound examinations of the pelvis were performed. Transabdominal technique was performed for global imaging of the pelvis including uterus, ovaries, adnexal regions, and pelvic cul-de-sac. It was necessary to proceed with endovaginal exam following the transabdominal exam to visualize the endometrium and  ovaries. Color and duplex Doppler ultrasound was utilized to evaluate blood flow to the ovaries. COMPARISON:  CT from earlier the same day. FINDINGS: Uterus Measurements: 7.4 x 3.7 x 5.3 cm = volume: 74.7 mL. Uterus is anteverted. 1.8 x 1.5 x 1.4 cm intramural fibroid present at the right posterior uterine fundus. Endometrium Thickness: 4.6 mm.  No focal abnormality visualized. Right ovary Measurements: 2.0 x 1.2 x 1.9 cm = volume: 2.3 mL. Normal appearance/no adnexal mass. Left ovary Measurements: 2.3 x 1.7 x 1.9 cm =  volume: 3.8 mL. Normal appearance/no adnexal mass. Pulsed Doppler evaluation of both ovaries demonstrates normal low-resistance arterial and venous waveforms. Other findings No abnormal free fluid. IMPRESSION: 1. 1.8 cm intramural fibroid at the right posterior uterine fundus. 2. Otherwise unremarkable and normal pelvic ultrasound. No evidence for torsion or other acute abnormality. Electronically Signed   By: Jeannine Boga M.D.   On: 01/14/2022 21:41    Procedures Procedures    Medications Ordered in ED Medications  ondansetron (ZOFRAN) 4 MG/2ML injection (has no administration in time range)  ondansetron (ZOFRAN) injection 4 mg (4 mg Intravenous Given 01/14/22 1515)  sodium chloride 0.9 % bolus 1,000 mL (0 mLs Intravenous Stopped 01/14/22 2158)  famotidine (PEPCID) IVPB 20 mg premix (0 mg Intravenous Stopped 01/14/22 2158)  iohexol (OMNIPAQUE) 350 MG/ML injection 100 mL (85 mLs Intravenous Contrast Given 01/14/22 2058)    ED Course/ Medical Decision Making/ A&P Clinical Course as of 01/14/22 2159  Tue Jan 14, 2022  1919 WBC(!): 25.2 [SB]  2158 Patient reevaluated and noted improvement of symptoms with treatment regimen in the ED.  She notes that she still has a burning sensation to her esophagus and sore throat however her symptoms are overall improved. [SB]    Clinical Course User Index [SB] Kaylan Yates A, PA-C                           Medical Decision Making Amount  and/or Complexity of Data Reviewed Labs: ordered. Decision-making details documented in ED Course. Radiology: ordered. ECG/medicine tests: ordered.  Risk Prescription drug management.   Patient presents to the emergency department with dark brown emesis and nonbloody diarrhea onset today.  Has sick contacts of her children with similar symptoms.  Was evaluated in 2021 due to similar symptoms and had negative GI work-up with gastroenterologist, Dr. Rush Landmark.  Vital signs stable, patient afebrile, not tachycardic or hypoxic.  Patient notes that she started her menstrual cycle approximately 1.5 weeks ago and took a Plan B last week and started taking an OCP 2-3 days later.  She noted today with some vaginal bleeding with some vaginal tissues noted.  On exam patient with tenderness to palpation noted to suprapubic, left lower quadrant, epigastric region.  Negative CVA tenderness noted bilaterally.  GU exam with nurse tech chaperone present.  Yellow discharge noted from cervical os and bleeding present.  Differential diagnosis includes pancreatitis, cholecystitis, appendicitis, UTI, gonorrhea, chlamydia, retained uterine products.   Labs:  I ordered, and personally interpreted labs.  The pertinent results include:   Wet prep unremarkable. Negative hCG Negative COVID and flu swab. Urinalysis notable for moderate amount of hemoglobin otherwise unremarkable (patient notes that she started to have an episode of vaginal bleeding today). Lipase at 13 and unremarkable. CBC with leukocytosis at 25.2 otherwise unremarkable. CMP with elevated glucose at 192 otherwise unremarkable  Imaging: I ordered imaging studies including US pelvic and CT abdomen pelvis I independently visualized and interpreted imaging which showed :  US Pelvic:  1. 1.8 cm intramural fibroid at the right posterior uterine fundus.  2. Otherwise unremarkable and normal pelvic ultrasound. No evidence  for torsion or other acute  abnormality.  CT abdomen pelvis:  1. 1.2 cm nonobstructing right renal calculus.  2. Small hiatal hernia.  3. Small uterine fibroid.   I agree with the radiologist interpretation  Medications:  I ordered medication including pepcid, IVF, zofran for symptom managment.  Reevaluation of the patient after these medicines and  interventions, I reevaluated the patient and found that they have improved I have reviewed the patients home medicines and have made adjustments as needed  Patient case discussed with Dr. Johnney Killian at sign-out. Plan at sign-out is pending symptom management, wet prep. Pt may go home if symptoms overall improved and tolerating PO. Patient care transferred at sign out.   This chart was dictated using voice recognition software, Dragon. Despite the best efforts of this provider to proofread and correct errors, errors may still occur which can change documentation meaning.    Final Clinical Impression(s) / ED Diagnoses Final diagnoses:  Lower abdominal pain    Rx / DC Orders ED Discharge Orders     None         Leigh Kaeding A, PA-C 01/14/22 2221    Charlesetta Shanks, MD 01/16/22 2314

## 2022-01-14 NOTE — ED Provider Notes (Signed)
I provided a substantive portion of the care of this patient.  I personally performed the entirety of the medical decision making for this encounter. ? ?EKG Interpretation ? ?Date/Time:  Tuesday January 14 2022 18:11:35 EST ?Ventricular Rate:  53 ?PR Interval:  59 ?QRS Duration: 108 ?QT Interval:  512 ?QTC Calculation: 481 ?R Axis:   74 ?Text Interpretation: Sinus rhythm Short PR interval Consider right atrial enlargement Abnormal R-wave progression, early transition no ischemic appearance, normal no old comparison Confirmed by Charlesetta Shanks [phone]) on 01/14/2022 7:33:58 PM  ? ?Needs recheck and disposition. Wet prep pending. ? ?Wet prep unremarkable. ? ?Patient is alert nontoxic.  She feels much better.  She has now drinking fluids and request soda crackers. ? ?At this time stable for discharge.  Patient is clinically well in appearance now and tolerating oral intake. ?  ?Charlesetta Shanks, MD ?01/14/22 2333 ? ?

## 2022-01-14 NOTE — ED Triage Notes (Signed)
Pt arrives via EMS from home with v/d since today at 11. Reports she has had fevers, nauseous and abd pain for 3 days. 4 mg zofran given by EMS. 22G RAC ?

## 2022-01-16 LAB — GC/CHLAMYDIA PROBE AMP (~~LOC~~) NOT AT ARMC
Chlamydia: NEGATIVE
Comment: NEGATIVE
Comment: NORMAL
Neisseria Gonorrhea: NEGATIVE

## 2022-01-17 ENCOUNTER — Other Ambulatory Visit: Payer: Self-pay | Admitting: Allergy

## 2022-10-23 ENCOUNTER — Other Ambulatory Visit: Payer: Self-pay | Admitting: Allergy

## 2022-12-05 ENCOUNTER — Other Ambulatory Visit: Payer: Self-pay | Admitting: Allergy

## 2023-02-23 ENCOUNTER — Emergency Department (HOSPITAL_BASED_OUTPATIENT_CLINIC_OR_DEPARTMENT_OTHER): Payer: Managed Care, Other (non HMO)

## 2023-02-23 ENCOUNTER — Other Ambulatory Visit: Payer: Self-pay

## 2023-02-23 ENCOUNTER — Encounter (HOSPITAL_BASED_OUTPATIENT_CLINIC_OR_DEPARTMENT_OTHER): Payer: Self-pay | Admitting: Emergency Medicine

## 2023-02-23 ENCOUNTER — Emergency Department (HOSPITAL_BASED_OUTPATIENT_CLINIC_OR_DEPARTMENT_OTHER)
Admission: EM | Admit: 2023-02-23 | Discharge: 2023-02-23 | Disposition: A | Discharge status: Home / Self Care | Payer: Managed Care, Other (non HMO) | Attending: Emergency Medicine | Admitting: Emergency Medicine

## 2023-02-23 DIAGNOSIS — W2209XA Striking against other stationary object, initial encounter: Secondary | ICD-10-CM | POA: Insufficient documentation

## 2023-02-23 DIAGNOSIS — S161XXA Strain of muscle, fascia and tendon at neck level, initial encounter: Secondary | ICD-10-CM

## 2023-02-23 DIAGNOSIS — S0990XA Unspecified injury of head, initial encounter: Secondary | ICD-10-CM | POA: Insufficient documentation

## 2023-02-23 DIAGNOSIS — Z87891 Personal history of nicotine dependence: Secondary | ICD-10-CM | POA: Insufficient documentation

## 2023-02-23 DIAGNOSIS — Z7951 Long term (current) use of inhaled steroids: Secondary | ICD-10-CM | POA: Insufficient documentation

## 2023-02-23 DIAGNOSIS — M542 Cervicalgia: Secondary | ICD-10-CM | POA: Diagnosis present

## 2023-02-23 DIAGNOSIS — J45909 Unspecified asthma, uncomplicated: Secondary | ICD-10-CM | POA: Diagnosis not present

## 2023-02-23 MED ORDER — ONDANSETRON 8 MG PO TBDP: 8.0000 mg | ORAL_TABLET | Freq: Three times a day (TID) | ORAL | 0 refills | Status: AC | PRN

## 2023-02-23 MED ORDER — ONDANSETRON 4 MG PO TBDP
4.0000 mg | ORAL_TABLET | Freq: Once | ORAL | Status: AC | PRN
Start: 2023-02-23 — End: 2023-02-23
  Administered 2023-02-23: 4 mg via ORAL
  Filled 2023-02-23: qty 1

## 2023-02-23 MED ORDER — ACETAMINOPHEN 325 MG PO TABS: 650.0000 mg | ORAL_TABLET | Freq: Once | ORAL | Status: DC | PRN

## 2023-02-23 NOTE — ED Triage Notes (Signed)
Pt arrives via POV d/t head injury. States around 11:45 am today a ladder from the attic hit the top of her head. Since then c/o head pain, neck pain, and nausea. Pt a&ox4. Ambulatory in triage. No LOC. No blood thinner.

## 2023-02-23 NOTE — ED Triage Notes (Signed)
C collar placed in triage

## 2023-02-23 NOTE — ED Provider Notes (Signed)
[facility] Provider Note: Amber Dell, MD, FACEP  CSN: [id number] MRN: [id number] ARRIVAL: 02/23/23 at 2102 ROOM: DB001/DB001   CHIEF COMPLAINT  Head Injury   HISTORY OF PRESENT ILLNESS  02/23/23 11:12 PM Amber Pope is a 37 y.o. female planned 11:45 AM today a ladder from an attic hit her on top of her head.  She did not lose consciousness and she is not on anticoagulation.  Since this event she has had headache, neck pain and nausea without vomiting.  She rates her headache as a 7 out of 10.  She had transient relief of nausea with ODT Zofran in triage.   Past Medical History:  Diagnosis Date   Allergy    Anxiety    Asthma    Complication of anesthesia    tends to experience "excessive hysteria" post op   Constipation    Constipation    CRPS (complex regional pain syndrome type I)    [facility], Dr. Rolene Course   Depression    Family history of cancer    esophageal, father   Gastric ulcer    GERD (gastroesophageal reflux disease)    GERD (gastroesophageal reflux disease)    Panic disorder    Renal calculus    Wears glasses     Past Surgical History:  Procedure Laterality Date   ABLATION ON ENDOMETRIOSIS N/A 01/06/2013   Procedure: Robotic-Assisted Resection of Endometriosis;  Surgeon: Lenoard Aden, MD;  Location: [facility];  Service: Gynecology;  Laterality: N/A;   ESOPHAGOGASTRODUODENOSCOPY     2 prior, last in 2016, most recent eval with [facility]   kidney stent placement     with later removal   LITHOTRIPSY     x 2   ROBOTIC ASSISTED LAPAROSCOPIC LYSIS OF ADHESION N/A 01/06/2013   Procedure: Robotic-Assisted Laparoscopic Lysis of Adhesions;  Surgeon: Lenoard Aden, MD;  Location: [facility];  Service: Gynecology;  Laterality: N/A;   TENDON RECONSTRUCTION  10/2015   right foot    Family History  Problem Relation Age of Onset   Diabetes Paternal Grandfather    Diabetes Maternal Grandfather    COPD Maternal Grandfather     Esophageal cancer Father    GER disease Father    Barrett's esophagus Father    Hypertension Father    GER disease Mother    Depression Mother    Hyperlipidemia Mother    Ulcers Brother    Sleep apnea Brother    GER disease Brother    COPD Maternal Grandmother    Dementia Maternal Grandmother    GER disease Son    Other Son        anal stenosis   Heart disease Neg Hx    Stroke Neg Hx    Colon cancer Neg Hx    Stomach cancer Neg Hx    Colon polyps Neg Hx    Rectal cancer Neg Hx    Inflammatory bowel disease Neg Hx    Liver disease Neg Hx    Pancreatic cancer Neg Hx     Social History   Tobacco Use   Smoking status: Former    Types: Cigarettes    Quit date: 2017    Years since quitting: 7.2   Smokeless tobacco: Never   Tobacco comments:    prior social smoker, not prior regular smoker  Vaping Use   Vaping Use: Never used  Substance Use Topics   Alcohol use: No    Comment: occasional  Drug use: Not Currently    Types: Marijuana    Comment: 2 times weekly     Prior to Admission medications   Medication Sig Start Date End Date Taking? Authorizing Provider  ondansetron (ZOFRAN-ODT) 8 MG disintegrating tablet Take 1 tablet (8 mg total) by mouth every 8 (eight) hours as needed for nausea or vomiting. 02/23/23  Yes Lilith Solana, MD  albuterol (PROVENTIL) (2.5 MG/3ML) 0.083% nebulizer solution Take 2.5 mg by nebulization every 6 (six) hours. 06/17/21   [provider]  ALPRAZolam Prudy Feeler) 0.5 MG tablet Take 0.5 mg by mouth at bedtime as needed for anxiety. As needed 1/2 tab    [provider]  aluminum hydroxide-magnesium carbonate (GAVISCON) 95-358 MG/15ML SUSP Take by mouth.    [provider]  budesonide (PULMICORT) 0.5 MG/2ML nebulizer solution TAKE 2 MLS (0.5 MG TOTAL) BY NEBULIZATION IN THE MORNING AND AT BEDTIME. TAKE TWICE A DAY DURING UPPER RESPIRATORY INFECTION FOR 1-2 WEEKS AT A TIME. 12/20/21   Ellamae Sia, DO  EPINEPHrine (AUVI-Q) 0.3  mg/0.3 mL IJ SOAJ injection Inject 0.3 mg into the muscle as needed for anaphylaxis. 09/18/21   Ellamae Sia, DO  famotidine (PEPCID) 20 MG tablet Take 20 mg by mouth 2 (two) times daily.    [provider]  Fluticasone-Umeclidin-Vilant (TRELEGY ELLIPTA) 200-62.5-25 MCG/ACT AEPB Inhale 1 puff into the lungs daily. Rinse mouth after each use. 09/18/21   Ellamae Sia, DO  hydrocortisone (ANUSOL-HC) 25 MG suppository Place 1 suppository (25 mg total) rectally at bedtime. QHS for 1 week then every other evening until Prescription is complete. Patient not taking: Reported on 09/18/2021 05/25/21   Mansouraty, Netty Starring., MD  mirtazapine (REMERON) 15 MG tablet Take 15 mg by mouth at bedtime. 06/03/20   [provider]  montelukast (SINGULAIR) 10 MG tablet Take 1 tablet (10 mg total) by mouth at bedtime. 09/18/21   Ellamae Sia, DO  pantoprazole (PROTONIX) 40 MG tablet 1 tablet daily po 45 min prior to breakfast 01/11/20   Esterwood, Amy S, PA-C  sucralfate (CARAFATE) 1 GM/10ML suspension Take 10 mLs (1 g total) by mouth 4 (four) times daily. Patient not taking: Reported on 09/18/2021 05/29/20   Mansouraty, Netty Starring., MD  SYEDA 3-0.03 MG tablet Take 1 tablet by mouth daily. 06/10/20   [provider]  VENTOLIN HFA 108 (90 Base) MCG/ACT inhaler TAKE 2 PUFFS BY MOUTH EVERY 6 HOURS AS NEEDED FOR WHEEZE OR SHORTNESS OF BREATH 12/06/18   Henson, Vickie L, NP-C    Allergies Lodine [etodolac] and Cortisone   REVIEW OF SYSTEMS  Negative except as noted here or in the History of Present Illness.   PHYSICAL EXAMINATION  Initial Vital Signs Blood pressure 122/80, pulse 76, temperature 98.5 F (36.9 C), temperature source Oral, resp. rate 18, height 5\' 4"  (1.626 m), weight 61.7 kg, last menstrual period 02/03/2023, SpO2 100 %.  Examination General: Well-developed, well-nourished female in no acute distress; appearance consistent with age of record HENT: normocephalic; no hematoma seen or  palpated Eyes: pupils equal, round and reactive to light; extraocular muscles intact Neck: supple; no C-spine tenderness Heart: regular rate and rhythm Lungs: clear to auscultation bilaterally Abdomen: soft; nondistended; nontender; bowel sounds present Extremities: No deformity; full range of motion Neurologic: Awake, alert and oriented; motor function intact in all extremities and symmetric; no facial droop; normal coordination, speech and gait Skin: Warm and dry Psychiatric: Normal mood and affect   RESULTS  Summary of this visit's results,  reviewed and interpreted by myself:   EKG Interpretation  Date/Time:    Ventricular Rate:    PR Interval:    QRS Duration:   QT Interval:    QTC Calculation:   R Axis:     Text Interpretation:         Laboratory Studies: No results found for this or any previous visit (from the past 24 hour(s)). Imaging Studies: CT Head Wo Contrast  Result Date: 02/23/2023 CLINICAL DATA:  Head trauma, moderate-severe; Neck trauma, dangerous injury mechanism (Age 55-64y) EXAM: CT HEAD WITHOUT CONTRAST CT CERVICAL SPINE WITHOUT CONTRAST TECHNIQUE: Multidetector CT imaging of the head and cervical spine was performed following the standard protocol without intravenous contrast. Multiplanar CT image reconstructions of the cervical spine were also generated. RADIATION DOSE REDUCTION: This exam was performed according to the departmental dose-optimization program which includes automated exposure control, adjustment of the mA and/or kV according to patient size and/or use of iterative reconstruction technique. COMPARISON:  CT head and C-spine 06/04/2009 FINDINGS: CT HEAD FINDINGS Brain: No evidence of large-territorial acute infarction. No parenchymal hemorrhage. No mass lesion. No extra-axial collection. No mass effect or midline shift. No hydrocephalus. Basilar cisterns are patent. Vascular: No hyperdense vessel. Skull: No acute fracture or focal lesion.  Sinuses/Orbits: Paranasal sinuses and mastoid air cells are clear. The orbits are unremarkable. Other: None. CT CERVICAL SPINE FINDINGS Alignment: Normal. Skull base and vertebrae: No acute fracture. No aggressive appearing focal osseous lesion or focal pathologic process. Soft tissues and spinal canal: No prevertebral fluid or swelling. No visible canal hematoma. Upper chest: Trace biapical pleural/pulmonary scarring. Other: None. IMPRESSION: 1. No acute intracranial abnormality. 2. No acute displaced fracture or traumatic listhesis of the cervical spine. Electronically Signed   By: Tish Frederickson M.D.   On: 02/23/2023 22:13   CT Cervical Spine Wo Contrast  Result Date: 02/23/2023 CLINICAL DATA:  Head trauma, moderate-severe; Neck trauma, dangerous injury mechanism (Age 80-64y) EXAM: CT HEAD WITHOUT CONTRAST CT CERVICAL SPINE WITHOUT CONTRAST TECHNIQUE: Multidetector CT imaging of the head and cervical spine was performed following the standard protocol without intravenous contrast. Multiplanar CT image reconstructions of the cervical spine were also generated. RADIATION DOSE REDUCTION: This exam was performed according to the departmental dose-optimization program which includes automated exposure control, adjustment of the mA and/or kV according to patient size and/or use of iterative reconstruction technique. COMPARISON:  CT head and C-spine 06/04/2009 FINDINGS: CT HEAD FINDINGS Brain: No evidence of large-territorial acute infarction. No parenchymal hemorrhage. No mass lesion. No extra-axial collection. No mass effect or midline shift. No hydrocephalus. Basilar cisterns are patent. Vascular: No hyperdense vessel. Skull: No acute fracture or focal lesion. Sinuses/Orbits: Paranasal sinuses and mastoid air cells are clear. The orbits are unremarkable. Other: None. CT CERVICAL SPINE FINDINGS Alignment: Normal. Skull base and vertebrae: No acute fracture. No aggressive appearing focal osseous lesion or focal  pathologic process. Soft tissues and spinal canal: No prevertebral fluid or swelling. No visible canal hematoma. Upper chest: Trace biapical pleural/pulmonary scarring. Other: None. IMPRESSION: 1. No acute intracranial abnormality. 2. No acute displaced fracture or traumatic listhesis of the cervical spine. Electronically Signed   By: Tish Frederickson M.D.   On: 02/23/2023 22:13    ED COURSE and MDM  Nursing notes, initial and subsequent vitals signs, including pulse oximetry, reviewed and interpreted by myself.  Vitals:   02/23/23 2124 02/23/23 2125  BP: 122/80   Pulse: 76   Resp: 18   Temp: 98.5 F (36.9 C)  TempSrc: Oral   SpO2: 100%   Weight:  61.7 kg  Height:   (1.626 m)   Medications  ondansetron (ZOFRAN-ODT) disintegrating tablet 4 mg (4 mg Oral Given 02/23/23 2143)   The patient is awake and alert.  There is no evidence of significant head or neck injury on CT scan.  She may have a mild concussion.    PROCEDURES  Procedures   ED DIAGNOSES     ICD-10-CM   1. Minor head injury, initial encounter  S09.90XA     2. Cervical strain, acute, initial encounter  S16.Benedetto Goad, MD 02/23/23 2320

## 2023-02-23 NOTE — ED Notes (Signed)
Discharge instructions were discussed with pt. Pt verbalized understanding with no additional questions at this time. Pt ambulatory at discharge. Pt to go home with s/o at bedside.

## 2023-05-14 IMAGING — US US PELVIS COMPLETE TRANSABD/TRANSVAG W DUPLEX AND/OR DOPPLER
1 series · 13 of 25 positions shown · non-contrast
Comparison: CT from earlier the same day.

CLINICAL DATA: Initial evaluation for acute lower abdominal pain.

EXAM:
TRANSABDOMINAL AND TRANSVAGINAL ULTRASOUND OF PELVIS
DOPPLER ULTRASOUND OF OVARIES
TECHNIQUE: Both transabdominal and transvaginal ultrasound examinations of the
pelvis were performed. Transabdominal technique was performed for
global imaging of the pelvis including uterus, ovaries, adnexal
regions, and pelvic cul-de-sac.
It was necessary to proceed with endovaginal exam following the
transabdominal exam to visualize the endometrium and ovaries. Color
and duplex Doppler ultrasound was utilized to evaluate blood flow to
the ovaries.

[Series 1: us pelvic complete w transvaginal and torsion righ · 67 acquisitions, 13 frames shown]
[im 1/67]
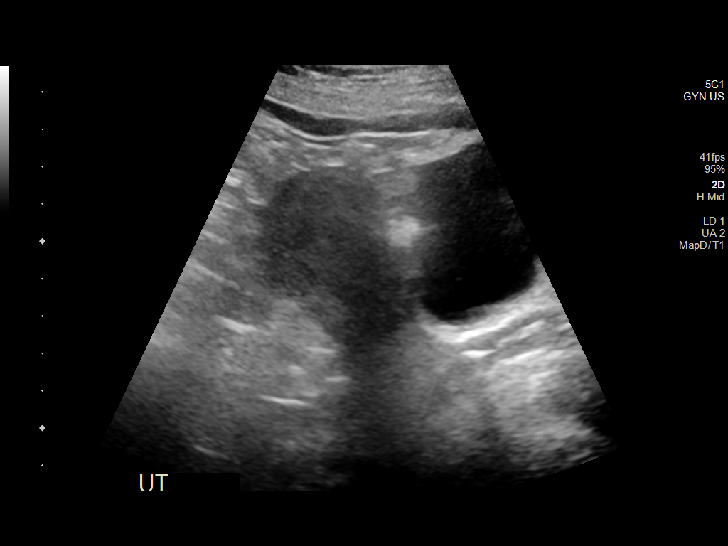
[im 6/67]
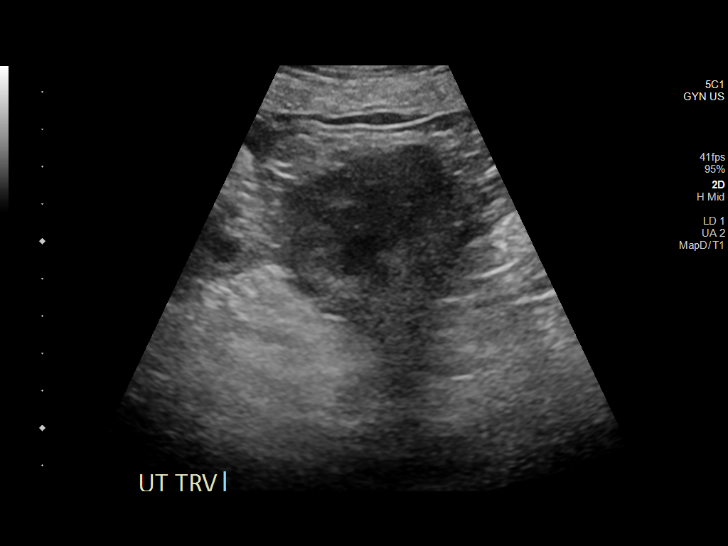
[im 12/67]
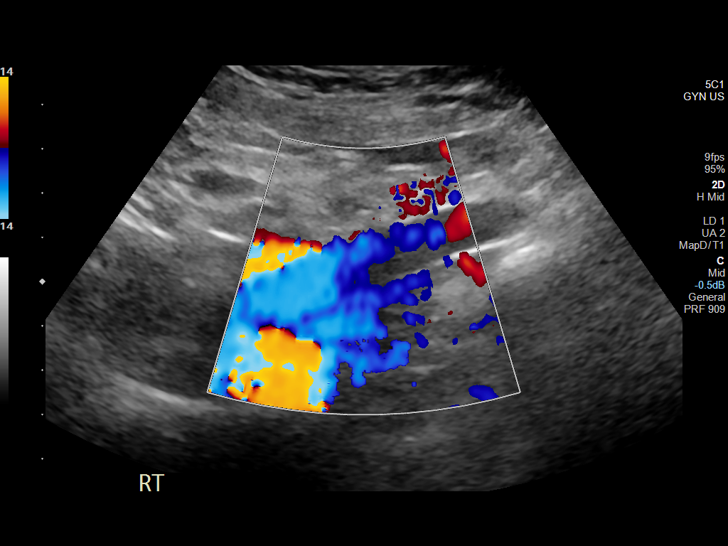
[im 17/67]
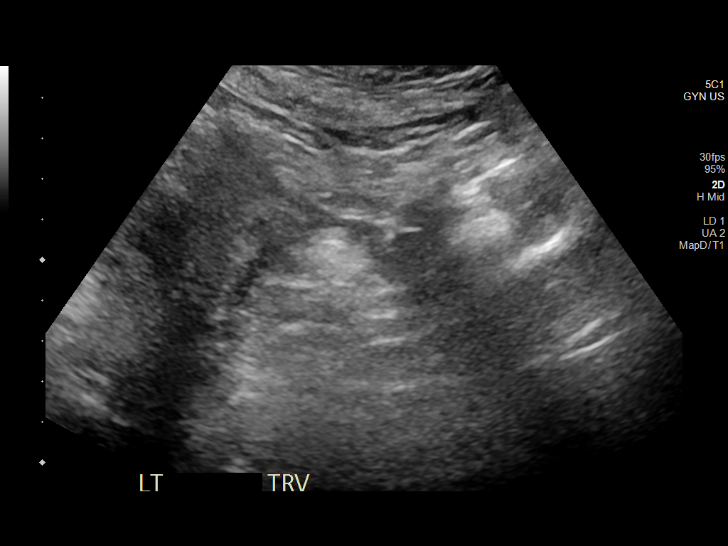
[im 23/67]
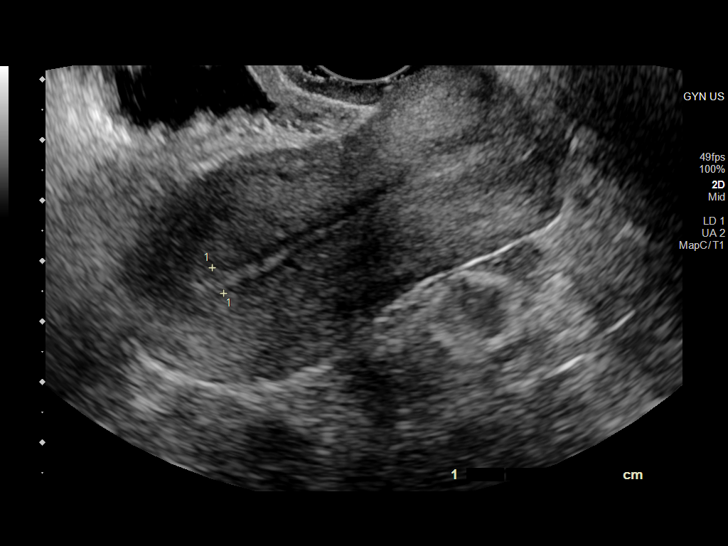
[im 28/67]
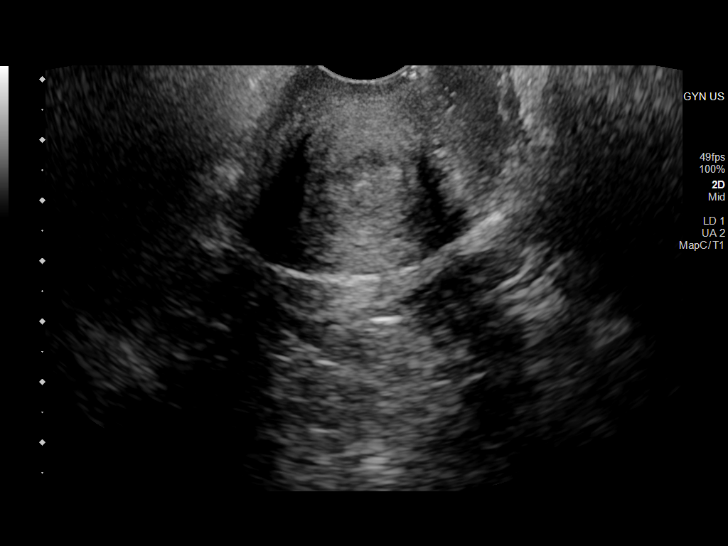
[im 34/67]
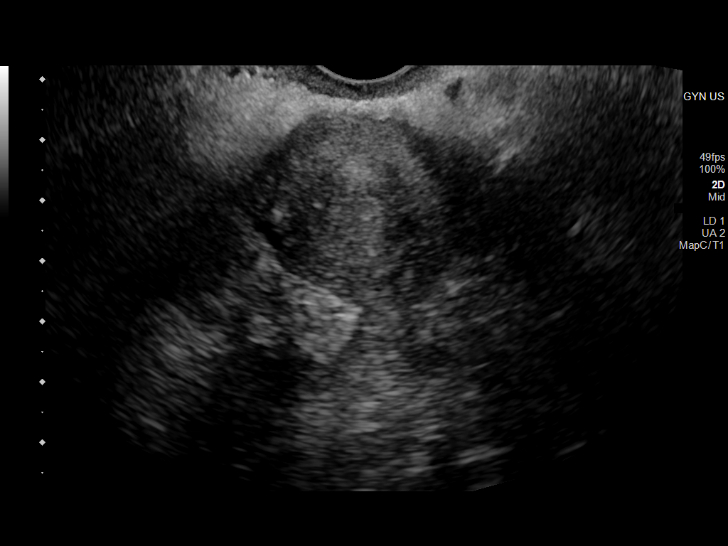
[im 39/67]
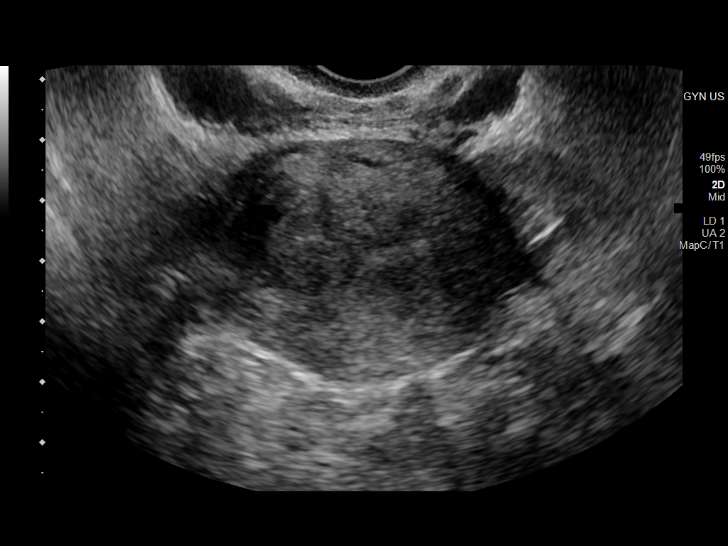
[im 45/67]
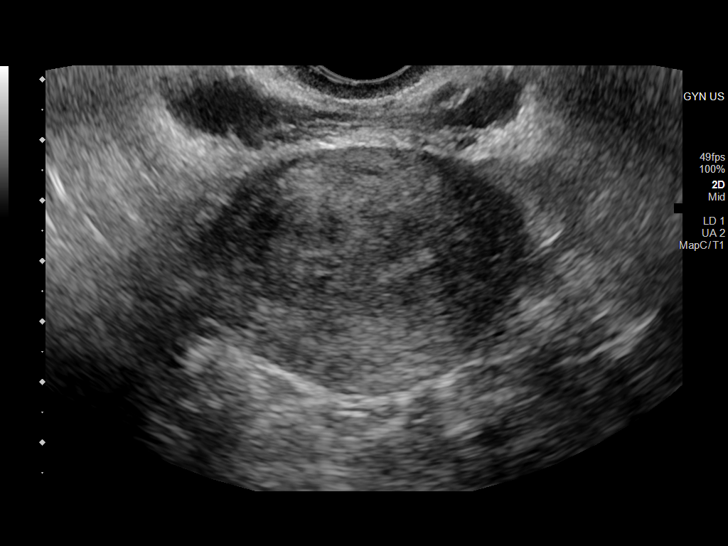
[im 50/67]
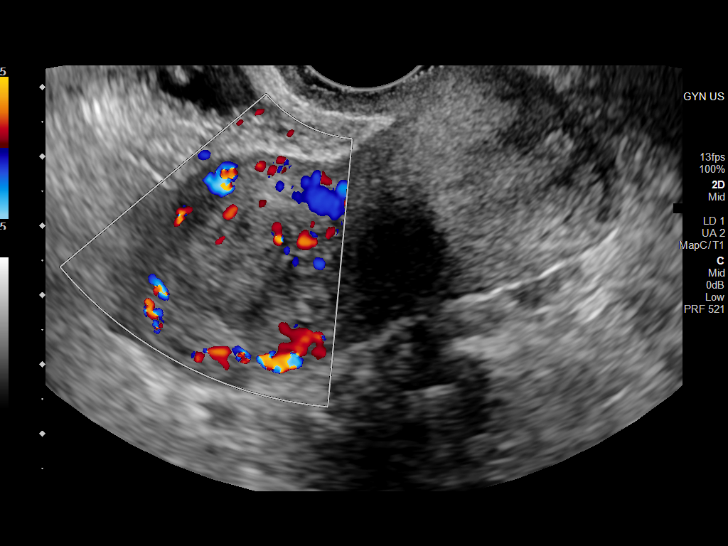
[im 56/67]
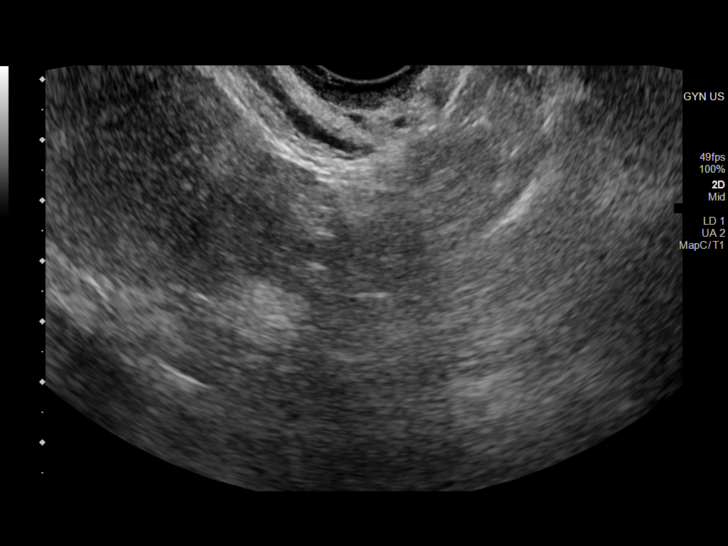
[im 61/67]
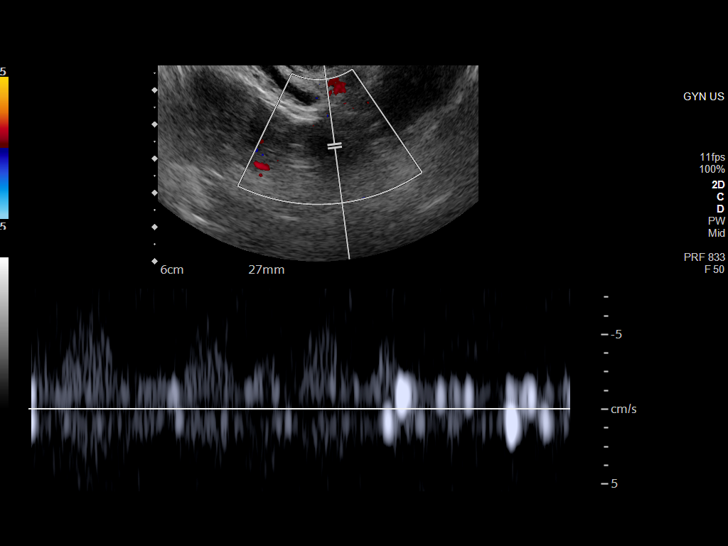
[im 67/67]
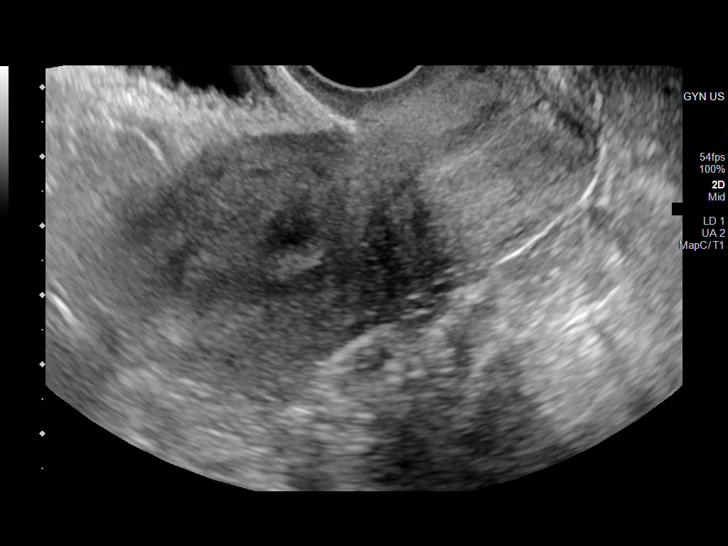

[13 of 25 positions shown; findings below may reference images not displayed]

FINDINGS: Uterus

Measurements: 7.4 x 3.7 x 5.3 cm = volume: 74.7 mL. Uterus is
anteverted. 1.8 x 1.5 x 1.4 cm intramural fibroid present at the
right posterior uterine fundus.

Endometrium

Thickness: 4.6 mm.  No focal abnormality visualized.

Right ovary

Measurements: 2.0 x 1.2 x 1.9 cm = volume: 2.3 mL. Normal
appearance/no adnexal mass.

Left ovary

Measurements: 2.3 x 1.7 x 1.9 cm = volume: 3.8 mL. Normal
appearance/no adnexal mass.

Pulsed Doppler evaluation of both ovaries demonstrates normal
low-resistance arterial and venous waveforms.

Other findings

No abnormal free fluid.
IMPRESSION: 1. 1.8 cm intramural fibroid at the right posterior uterine fundus.
2. Otherwise unremarkable and normal pelvic ultrasound. No evidence
for torsion or other acute abnormality.

## 2023-05-14 IMAGING — CT CT ABD-PELV W/ CM
2 of 4 series · 17 of 46 positions shown, 19 images · IV contrast (APPLIED)
Comparison: February 27, 2020

CLINICAL DATA: Nausea, vomiting, diarrhea and fever.

EXAM:
CT ABDOMEN AND PELVIS WITH CONTRAST
TECHNIQUE: Multidetector CT imaging of the abdomen and pelvis was performed
using the standard protocol following bolus administration of
intravenous contrast.

[Series 2: abd pel w · axial · 0.80mm/px · z∈[+747,+1142]mm · 14 of 87 slices shown, 16 images]
[im 4/87  soft-tissue]
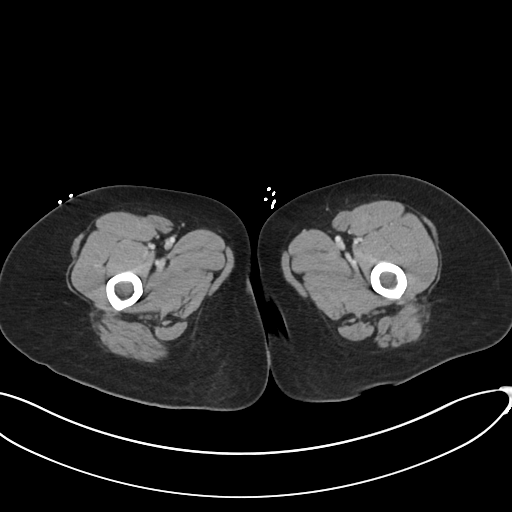
[im 4/87  bone]
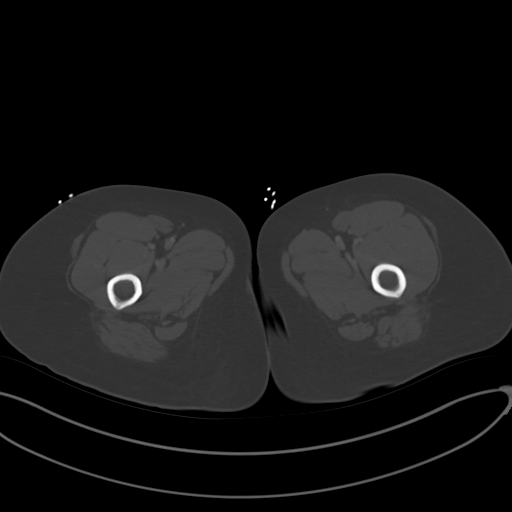
[im 10/87  soft-tissue]
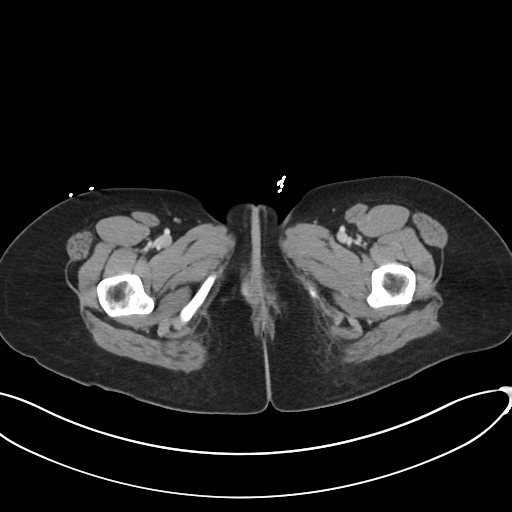
[im 17/87  soft-tissue]
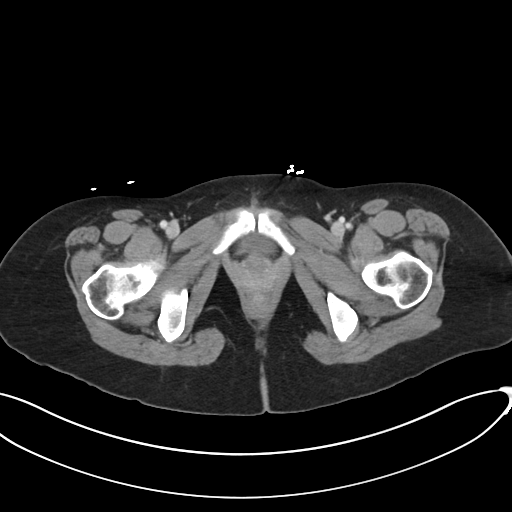
[im 24/87  soft-tissue]
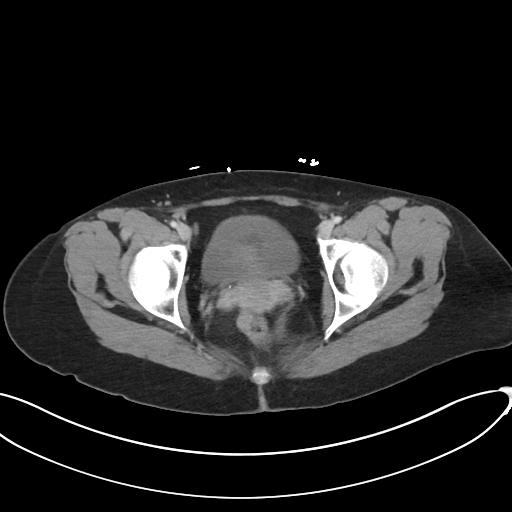
[im 30/87  soft-tissue]
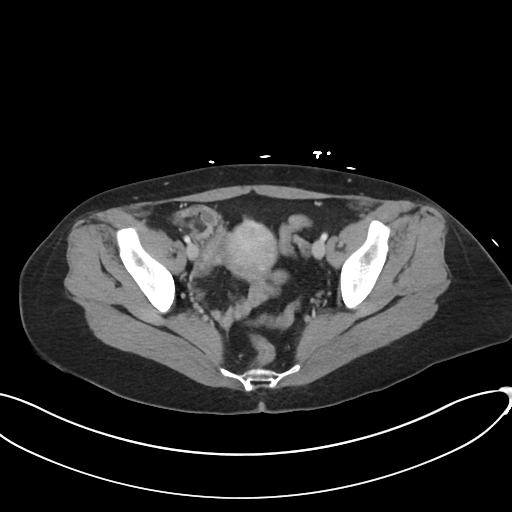
[im 34/87  soft-tissue]
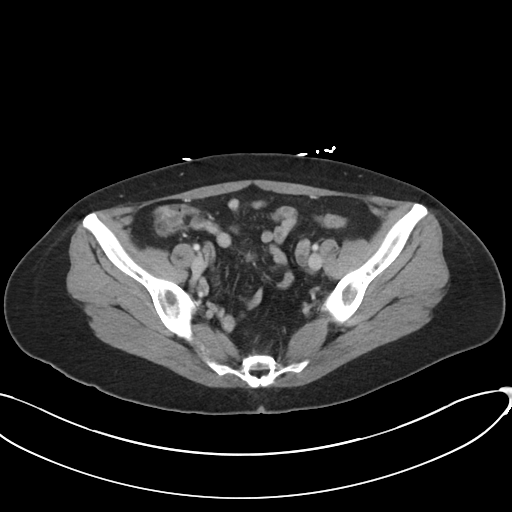
[im 40/87  soft-tissue]
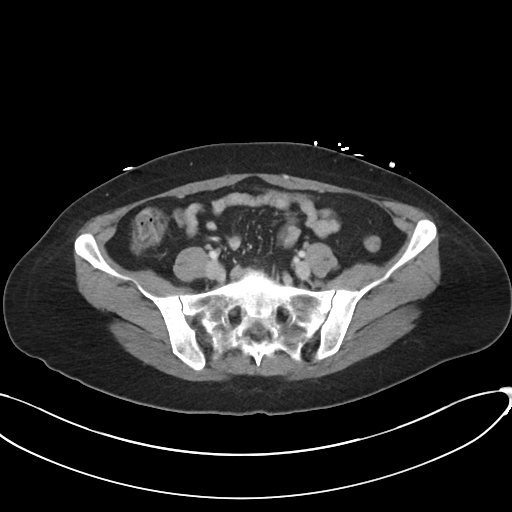
[im 47/87  soft-tissue]
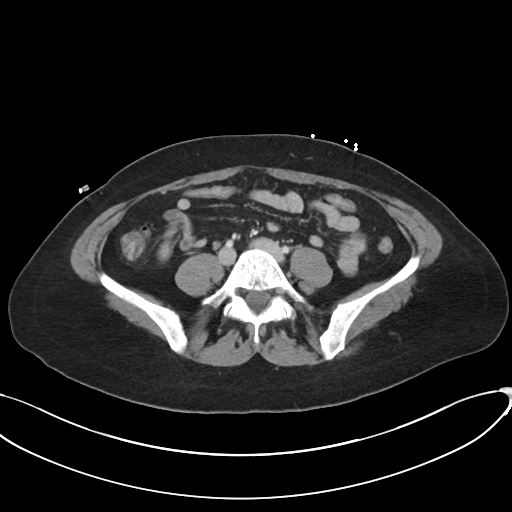
[im 53/87  soft-tissue]
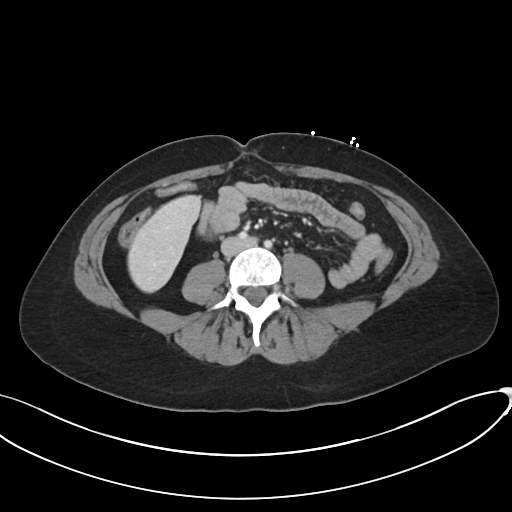
[im 53/87  bone]
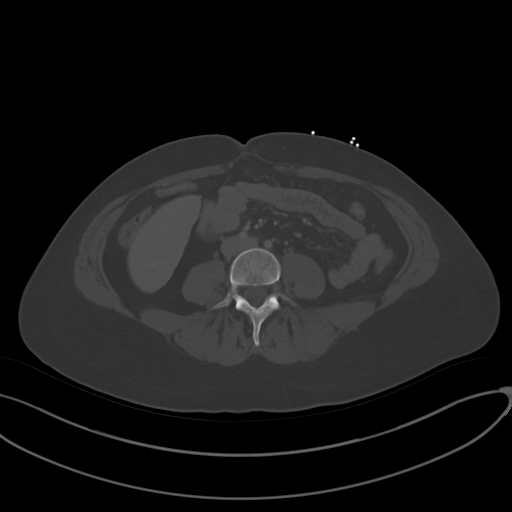
[im 57/87  soft-tissue]
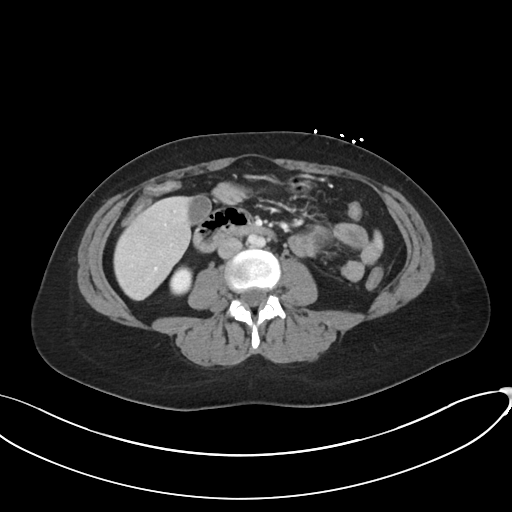
[im 63/87  soft-tissue]
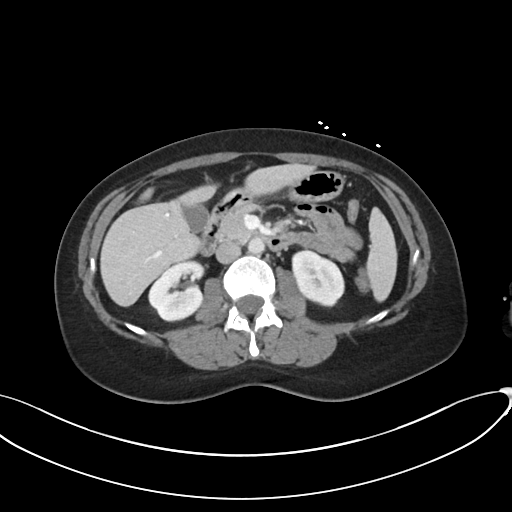
[im 70/87  soft-tissue]
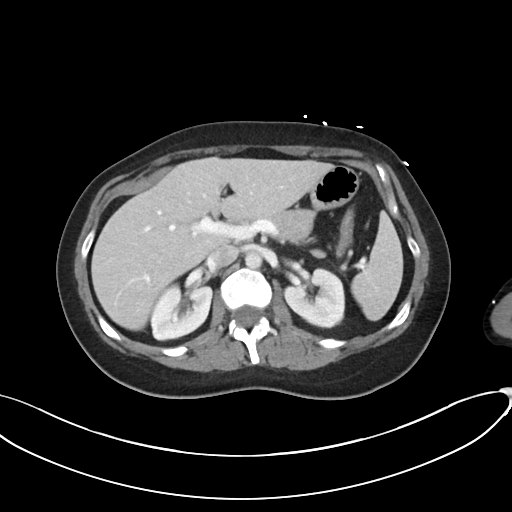
[im 77/87  soft-tissue]
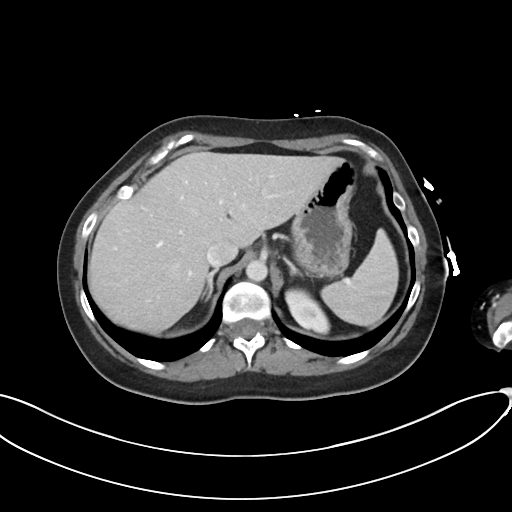
[im 83/87  soft-tissue]
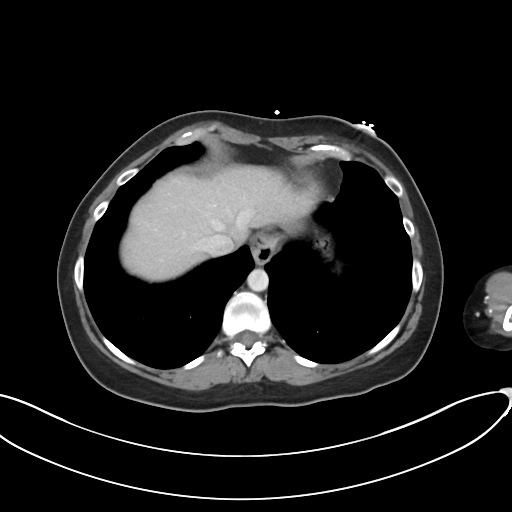

[Series 5: coronal · coronal · 0.86mm/px · 3 of 82 slices shown]
[im 28/82  soft-tissue]
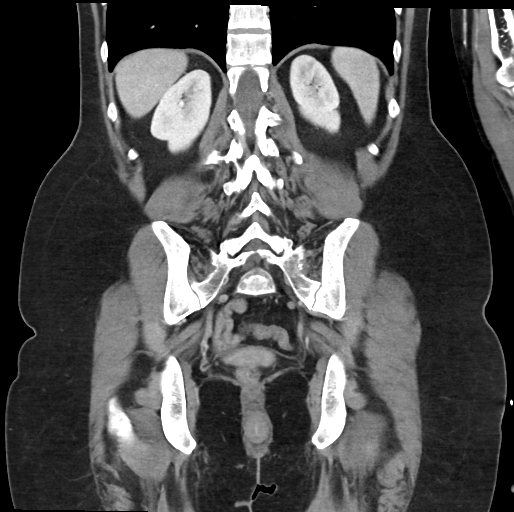
[im 37/82  soft-tissue]
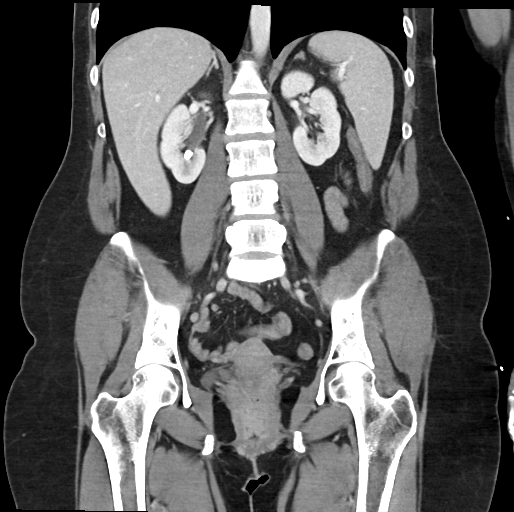
[im 46/82  soft-tissue]
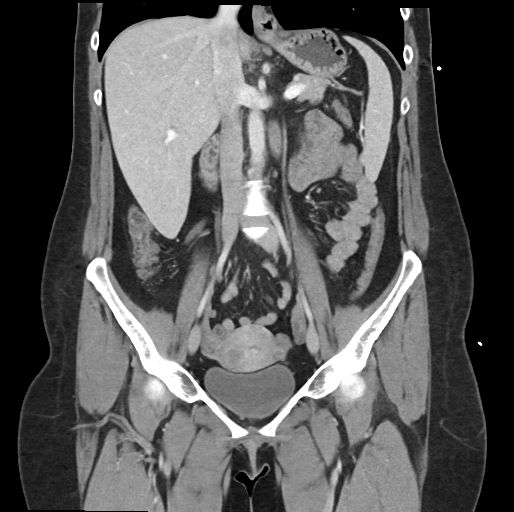

[17 of 46 positions shown; findings below may reference images not displayed]

RADIATION DOSE REDUCTION: This exam was performed according to the
departmental dose-optimization program which includes automated
exposure control, adjustment of the mA and/or kV according to
patient size and/or use of iterative reconstruction technique.

CONTRAST:  85mL OMNIPAQUE IOHEXOL 350 MG/ML SOLN
FINDINGS: Lower chest: No acute abnormality.

Hepatobiliary: No focal liver abnormality is seen. No gallstones,
gallbladder wall thickening, or biliary dilatation.

Pancreas: Unremarkable. No pancreatic ductal dilatation or
surrounding inflammatory changes.

Spleen: Normal in size without focal abnormality.

Adrenals/Urinary Tract: Adrenal glands are unremarkable. Kidneys are
normal, without obstructing renal calculi, focal lesion, or
hydronephrosis. A 1.2 cm nonobstructing renal calculus is seen
within the lower pole of the right kidney. Bladder is unremarkable.

Stomach/Bowel: There is a small hiatal hernia. Appendix appears
normal. No evidence of bowel wall thickening, distention, or
inflammatory changes.

Vascular/Lymphatic: No significant vascular findings are present. No
enlarged abdominal or pelvic lymph nodes.

Reproductive: A stable 15 mm x 11 mm focus of low attenuation is
seen within the lateral aspect of the body of the uterus on the
right. The bilateral adnexa are unremarkable.

Other: No abdominal wall hernia or abnormality. No abdominopelvic
ascites.

Musculoskeletal: No acute or significant osseous findings.
IMPRESSION: 1. 1.2 cm nonobstructing right renal calculus.
2. Small hiatal hernia.
3. Small uterine fibroid.

## 2023-10-16 ENCOUNTER — Other Ambulatory Visit: Payer: Self-pay | Admitting: Gastroenterology

## 2023-10-16 DIAGNOSIS — R1013 Epigastric pain: Secondary | ICD-10-CM

## 2023-10-16 DIAGNOSIS — R1011 Right upper quadrant pain: Secondary | ICD-10-CM

## 2023-11-17 ENCOUNTER — Other Ambulatory Visit: Payer: Managed Care, Other (non HMO)

## 2023-11-19 ENCOUNTER — Ambulatory Visit
Admission: RE | Admit: 2023-11-19 | Discharge: 2023-11-19 | Disposition: A | Discharge status: Home / Self Care | Payer: Managed Care, Other (non HMO) | Source: Ambulatory Visit | Attending: Gastroenterology | Admitting: Gastroenterology

## 2023-11-19 DIAGNOSIS — R1013 Epigastric pain: Secondary | ICD-10-CM

## 2023-11-19 DIAGNOSIS — R1011 Right upper quadrant pain: Secondary | ICD-10-CM

## 2024-08-22 ENCOUNTER — Ambulatory Visit: Payer: Self-pay | Admitting: Pulmonary Disease

## 2024-08-31 ENCOUNTER — Ambulatory Visit (INDEPENDENT_AMBULATORY_CARE_PROVIDER_SITE_OTHER): Payer: Self-pay

## 2024-08-31 ENCOUNTER — Other Ambulatory Visit: Payer: Self-pay

## 2024-08-31 VITALS — BP 108/75 | HR 78 | Temp 98.0°F | Ht 64.0 in | Wt 143.2 lb

## 2024-08-31 DIAGNOSIS — J4520 Mild intermittent asthma, uncomplicated: Secondary | ICD-10-CM

## 2024-08-31 DIAGNOSIS — U099 Post covid-19 condition, unspecified: Secondary | ICD-10-CM

## 2024-08-31 DIAGNOSIS — G9050 Complex regional pain syndrome I, unspecified: Secondary | ICD-10-CM

## 2024-08-31 DIAGNOSIS — J4489 Other specified chronic obstructive pulmonary disease: Secondary | ICD-10-CM

## 2024-08-31 DIAGNOSIS — Z23 Encounter for immunization: Secondary | ICD-10-CM

## 2024-08-31 DIAGNOSIS — Z87891 Personal history of nicotine dependence: Secondary | ICD-10-CM

## 2024-08-31 LAB — CBC WITH DIFFERENTIAL/PLATELET
Basophils Absolute: 0.1 K/uL (ref 0.0–0.1)
Basophils Relative: 0.8 % (ref 0.0–3.0)
Eosinophils Absolute: 0.1 K/uL (ref 0.0–0.7)
Eosinophils Relative: 1.6 % (ref 0.0–5.0)
HCT: 43.6 % (ref 36.0–46.0)
Hemoglobin: 14.5 g/dL (ref 12.0–15.0)
Lymphocytes Relative: 40.4 % (ref 12.0–46.0)
Lymphs Abs: 2.8 K/uL (ref 0.7–4.0)
MCHC: 33.1 g/dL (ref 30.0–36.0)
MCV: 93.4 fl (ref 78.0–100.0)
Monocytes Absolute: 0.2 K/uL (ref 0.1–1.0)
Monocytes Relative: 3.4 % (ref 3.0–12.0)
Neutro Abs: 3.8 K/uL (ref 1.4–7.7)
Neutrophils Relative %: 53.8 % (ref 43.0–77.0)
Platelets: 257 K/uL (ref 150.0–400.0)
RBC: 4.67 Mil/uL (ref 3.87–5.11)
RDW: 12.7 % (ref 11.5–15.5)
WBC: 7 K/uL (ref 4.0–10.5)

## 2024-08-31 MED ORDER — AIRSUPRA 90-80 MCG/ACT IN AERO: 1.0000 | INHALATION_SPRAY | RESPIRATORY_TRACT | 7 refills | Status: DC | PRN

## 2024-08-31 MED ORDER — PREDNISONE 20 MG PO TABS: 40.0000 mg | ORAL_TABLET | Freq: Every day | ORAL | 0 refills | Status: AC

## 2024-08-31 NOTE — Telephone Encounter (Signed)
 Please advise on possible alternatives, thank you!

## 2024-08-31 NOTE — Progress Notes (Signed)
 Subjective:   PATIENT ID: Allean GORMAN Eck GENDER: female DOB: July 31, 1986, MRN: [id number]   HPI Discussed the use of AI scribe software for clinical note transcription with the patient, who gave verbal consent to proceed.  History of Present Illness EMILI MCLOUGHLIN is a 38 year old female with asthma and long COVID who presents with persistent wheezing and respiratory issues. She was referred by her primary care doctor for evaluation of potential lung damage due to persistent bronchitis and wheezing.  She has a history of asthma since childhood, which exacerbates significantly with respiratory infections such as bronchitis. In May 2025, she contracted COVID-19, leading to a diagnosis of long COVID with prolonged symptoms including severe cough and pneumonia that persisted throughout the summer of 2025.  In the past two months, she experienced another respiratory illness following a cruise, resulting in persistent wheezing and difficulty breathing. This has significantly impacted her daily activities, including difficulty walking at a concert three weeks ago. Despite previous treatment with steroids for bronchitis about a month and a half ago, she has not fully recovered.  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 She has not used prednisone  in a couple of years due to severe gastrointestinal side effects. She also uses recreational marijuana to manage her complex regional pain syndrome (CRPS), which is coming out of remission.  Her family history includes a father who died of esophageal cancer and a mother with severe COPD. She mentions a history of exposure to secondhand smoke during childhood, which she believes contributed to her respiratory issues. She has eliminated cats and birds from her home due to allergies and currently has a dog.  She reports severe stomach pain with Augmentin  and amoxicillin , which she  previously considered an allergy, but acknowledges this is a side effect rather than a true allergy. She clarifies that she is not allergic to cortisone. No recent hospital admissions or need for intubation. Confirms a lifelong history of asthma, exacerbated by her mother's smoking habits during her childhood.     Past Medical History:  Diagnosis Date   Allergy    Anxiety    Asthma    Complication of anesthesia    tends to experience excessive hysteria post op   Constipation    Constipation    CRPS (complex regional pain syndrome type I)    [facility], Dr. Darwyn Blanch   Depression    Family history of cancer    esophageal, father   Gastric ulcer    GERD (gastroesophageal reflux disease)    GERD (gastroesophageal reflux disease)    Panic disorder    Renal calculus    Wears glasses      Family History  Problem Relation Age of Onset   Diabetes Paternal Grandfather    Diabetes Maternal Grandfather    COPD Maternal Grandfather    Esophageal cancer Father    GER disease Father    Barrett's esophagus Father    Hypertension Father    GER disease Mother    Depression Mother    Hyperlipidemia Mother    Ulcers Brother    Sleep apnea Brother    GER disease Brother    COPD Maternal Grandmother    Dementia Maternal Grandmother    GER disease Son    Other Son        anal stenosis   Heart disease Neg Hx    Stroke  Neg Hx    Colon cancer Neg Hx    Stomach cancer Neg Hx    Colon polyps Neg Hx    Rectal cancer Neg Hx    Inflammatory bowel disease Neg Hx    Liver disease Neg Hx    Pancreatic cancer Neg Hx      Social History   Socioeconomic History   Marital status: Married    Spouse name: Not on file   Number of children: 1   Years of education: Not on file   Highest education level: Not on file  Occupational History    Employer: [employer]  Tobacco Use   Smoking status: Former    Current packs/day: 0.00    Types: Cigarettes    Quit date: 2017     Years since quitting: 8.8   Smokeless tobacco: Never   Tobacco comments:    prior social smoker, not prior regular smoker  Vaping Use   Vaping status: Never Used  Substance and Sexual Activity   Alcohol use: No    Comment: occasional   Drug use: Not Currently    Types: Marijuana    Comment: 2 times weekly    Sexual activity: Yes  Other Topics Concern   Not on file  Social History Narrative   Lives with husband and son.  Photographer, was model and actress prior.  Exercise - walks, goes to gym 2 days per week.   Diet - most home cooked, healthy.   09/2018   Social Drivers of Corporate investment banker Strain: Not on file  Food Insecurity: Not on file  Transportation Needs: Not on file  Physical Activity: Not on file  Stress: Not on file  Social Connections: Unknown (03/13/2022)   Received from [facility]   Social Network    Social Network: Not on file  Intimate Partner Violence: Unknown (02/12/2022)   Received from [facility]   HITS    Physically Hurt: Not on file    Insult or Talk Down To: Not on file    Threaten Physical Harm: Not on file    Scream or Curse: Not on file     Allergies  Allergen Reactions   Lodine [Etodolac] Anaphylaxis and Rash    Has taken ibuprofen  in past with no problems, per patient   Cortisone Other (See Comments)    Questionable allergy?     Outpatient Medications Prior to Visit  Medication Sig Dispense Refill   albuterol  (PROVENTIL ) (2.5 MG/3ML) 0.083% nebulizer solution Take 2.5 mg by nebulization every 6 (six) hours.     ALPRAZolam (XANAX) 0.5 MG tablet Take 0.5 mg by mouth at bedtime as needed for anxiety. As needed 1/2 tab     aluminum  hydroxide-magnesium  carbonate (GAVISCON) 95-358 MG/15ML SUSP Take by mouth.     budesonide  (PULMICORT ) 0.5 MG/2ML nebulizer solution TAKE 2 MLS (0.5 MG TOTAL) BY NEBULIZATION IN THE MORNING AND AT BEDTIME. TAKE TWICE A DAY DURING UPPER RESPIRATORY INFECTION FOR 1-2 WEEKS AT A TIME. 60 mL 0    EPINEPHrine  (AUVI-Q ) 0.3 mg/0.3 mL IJ SOAJ injection Inject 0.3 mg into the muscle as needed for anaphylaxis. 2 each 1   famotidine  (PEPCID ) 20 MG tablet Take 20 mg by mouth 2 (two) times daily.     Fluticasone-Umeclidin-Vilant (TRELEGY ELLIPTA ) 200-62.5-25 MCG/ACT AEPB Inhale 1 puff into the lungs daily. Rinse mouth after each use. 60 each 2   mirtazapine (REMERON) 15 MG tablet Take 15 mg by mouth at bedtime.  montelukast  (SINGULAIR ) 10 MG tablet Take 1 tablet (10 mg total) by mouth at bedtime. 30 tablet 5   ondansetron  (ZOFRAN -ODT) 8 MG disintegrating tablet Take 1 tablet (8 mg total) by mouth every 8 (eight) hours as needed for nausea or vomiting. 10 tablet 0   pantoprazole  (PROTONIX ) 40 MG tablet 1 tablet daily po 45 min prior to breakfast 30 tablet 6   SYEDA 3-0.03 MG tablet Take 1 tablet by mouth daily.     VENTOLIN  HFA 108 (90 Base) MCG/ACT inhaler TAKE 2 PUFFS BY MOUTH EVERY 6 HOURS AS NEEDED FOR WHEEZE OR SHORTNESS OF BREATH 18 Inhaler 0   hydrocortisone  (ANUSOL -HC) 25 MG suppository Place 1 suppository (25 mg total) rectally at bedtime. QHS for 1 week then every other evening until Prescription is complete. (Patient not taking: Reported on 08/31/2024) 12 suppository 0   sucralfate  (CARAFATE ) 1 GM/10ML suspension Take 10 mLs (1 g total) by mouth 4 (four) times daily. (Patient not taking: Reported on 08/31/2024) 420 mL 1   Facility-Administered Medications Prior to Visit  Medication Dose Route Frequency Provider Last Rate Last Admin   0.9 %  sodium chloride  infusion  500 mL Intravenous Once Mansouraty, Aloha Raddle., MD        ROS Reviewed all systems and reported negative except as above     Objective:   Vitals:   08/31/24 0945  BP: 108/75  Pulse: 78  Temp: 98 F (36.7 C)  TempSrc: Oral  SpO2: 97%  Weight: 143 lb 3.2 oz (65 kg)  Height: 5' 4 (1.626 m)    Physical Exam Constitutional:      Appearance: Normal appearance.  HENT:     Head: Normocephalic and atraumatic.      Nose: Nose normal.     Mouth/Throat:     Mouth: Mucous membranes are moist.  Cardiovascular:     Rate and Rhythm: Normal rate and regular rhythm.  Pulmonary:     Effort: Pulmonary effort is normal. No respiratory distress.     Breath sounds: No stridor. No wheezing or rhonchi.  Abdominal:     General: Abdomen is flat.  Skin:    General: Skin is warm.     Capillary Refill: Capillary refill takes less than 2 seconds.  Neurological:     General: No focal deficit present.     Mental Status: She is alert.      CBC    Component Value Date/Time   WBC 25.2 (H) 01/14/2022 1513   RBC 4.77 01/14/2022 1513   HGB 14.3 01/14/2022 1513   HGB 13.8 09/18/2021 1525   HCT 41.3 01/14/2022 1513   HCT 40.1 09/18/2021 1525   PLT 349 01/14/2022 1513   PLT 330 09/18/2021 1525   MCV 86.6 01/14/2022 1513   MCV 86 09/18/2021 1525   MCH 30.0 01/14/2022 1513   MCHC 34.6 01/14/2022 1513   RDW 12.8 01/14/2022 1513   RDW 12.2 09/18/2021 1525   LYMPHSABS 3.2 (H) 09/18/2021 1525   MONOABS 0.6 09/17/2020 0557   EOSABS 0.1 09/18/2021 1525   BASOSABS 0.1 09/18/2021 1525     Chest imaging:  PFT: Spirometry in the past with evidence of obstruction        Assessment & Plan:   Assessment and Plan Assessment & Plan Asthma with ongoing bronchitis Chronic asthma exacerbated by bronchitis, with persistent wheezing and dyspnea. Non-compliance with Trelegy noted. - Order PFTs including spirometry, lung volumes, and DLCO. - Prescribe Airsupra inhaler, one puff every four hours as needed. -  Instruct to resume daily Trelegy inhaler use. - Prescribe prednisone  40 mg daily for five days, with dose reduction option for side effects. - Order chest X-ray to assess lung damage. - Discussed Trelegy compliance and prednisone  side effects.  Long COVID Long COVID with prolonged recovery and respiratory symptoms. Could be just asthma with poor compliance, management is supportive anyway  Suspected  allergic rhinitis Suspected allergic rhinitis with avoidance of skin testing due to CRPS concerns. - Order allergy panel from blood.  Complex Regional Pain Syndrome (CRPS) CRPS with concern about exacerbation from certain treatments. - Discussed family history impact and avoiding unnecessary medications and procedures.  Gastrointestinal intolerance to antibiotics (not true allergy) Severe stomach pain with amoxicillin  and Augmentin , clarified as intolerance not allergy. - Educated on differentiating side effects from true allergies, especially with penicillin .        Zola Herter, MD [facility] Office: [phone]

## 2024-08-31 NOTE — Patient Instructions (Signed)
  VISIT SUMMARY: During your visit, we discussed your ongoing respiratory issues, including asthma exacerbated by bronchitis and long COVID symptoms. We also addressed your concerns about allergic rhinitis, complex regional pain syndrome (CRPS), and gastrointestinal intolerance to antibiotics.  YOUR PLAN: -ASTHMA WITH ONGOING BRONCHITIS: Asthma is a condition where your airways narrow and swell, producing extra mucus, which can make breathing difficult. Bronchitis is an inflammation of the lining of your bronchial tubes. You will need to undergo pulmonary function tests (PFTs) including spirometry, lung volumes, and DLCO to assess your lung function. You are prescribed an Airsupra inhaler to use one puff every four hours as needed. Please resume daily use of your Trelegy inhaler. Additionally, you are prescribed prednisone  40 mg daily for five days, with an option to reduce the dose if you experience side effects. A chest X-ray will be done to assess any lung damage.  -LONG COVID: Long COVID refers to prolonged symptoms following a COVID-19 infection. We discussed your ongoing recovery and respiratory symptoms.  -SUSPECTED ALLERGIC RHINITIS: Allergic rhinitis is an allergic reaction that causes sneezing, congestion, and a runny nose. We will order an allergy panel from your blood to identify any allergens.  -COMPLEX REGIONAL PAIN SYNDROME (CRPS): CRPS is a chronic pain condition that typically affects a limb after an injury. We discussed the impact of your family history and the importance of avoiding unnecessary medications and procedures.  -GASTROINTESTINAL INTOLERANCE TO ANTIBIOTICS (NOT TRUE ALLERGY): You experience severe stomach pain with amoxicillin  and Augmentin , which is an intolerance rather than a true allergy. We discussed how to differentiate side effects from true allergies, especially with penicillin .  INSTRUCTIONS: Please follow up with the pulmonary function tests, chest X-ray, and  allergy panel as ordered. Resume daily use of your Trelegy inhaler and use the Airsupra inhaler as needed. Take prednisone  40 mg daily for five days, and reduce the dose if you experience side effects. Continue to avoid unnecessary medications and procedures for your CRPS.                      Contains text generated by [employer]bridge.                                 Contains text generated by Abridge.

## 2024-09-01 ENCOUNTER — Telehealth: Payer: Self-pay

## 2024-09-01 ENCOUNTER — Other Ambulatory Visit (HOSPITAL_COMMUNITY): Payer: Self-pay

## 2024-09-01 LAB — RESPIRATORY ALLERGY PANEL REGION II W/ RFLX: ~~LOC~~

## 2024-09-01 LAB — INTERPRETATION:

## 2024-09-01 NOTE — Telephone Encounter (Signed)
*  Pulm  Pharmacy Patient Advocate Encounter   Received notification from RX Request Messages that prior authorization for Airsupra is required/requested.   Insurance verification completed.   The patient is insured through N/A.   Pending new insurance info-message sent to office

## 2024-09-07 NOTE — Telephone Encounter (Signed)
 Could you please clarify exactly what is needed for this pt? Is it an insurance card?

## 2024-09-12 NOTE — Telephone Encounter (Signed)
 Left message on patient VM to come by the clinic to give a copy of her prescription rx card to get the information so pharmacy team can get her benefits and get a PA for AirSupra or find an alternative medication.

## 2024-09-19 NOTE — Telephone Encounter (Signed)
 No response from patient with insurance card- closing request.

## 2024-11-13 ENCOUNTER — Emergency Department (HOSPITAL_COMMUNITY)

## 2024-11-13 ENCOUNTER — Encounter (HOSPITAL_COMMUNITY): Payer: Self-pay | Admitting: Emergency Medicine

## 2024-11-13 ENCOUNTER — Other Ambulatory Visit: Payer: Self-pay

## 2024-11-13 ENCOUNTER — Emergency Department (HOSPITAL_COMMUNITY)
Admission: EM | Admit: 2024-11-13 | Discharge: 2024-11-13 | Disposition: A | Discharge status: Home / Self Care | Attending: Emergency Medicine | Admitting: Emergency Medicine

## 2024-11-13 DIAGNOSIS — S20211A Contusion of right front wall of thorax, initial encounter: Secondary | ICD-10-CM | POA: Diagnosis not present

## 2024-11-13 DIAGNOSIS — M79641 Pain in right hand: Secondary | ICD-10-CM | POA: Diagnosis present

## 2024-11-13 DIAGNOSIS — S60141A Contusion of right ring finger with damage to nail, initial encounter: Secondary | ICD-10-CM | POA: Insufficient documentation

## 2024-11-13 DIAGNOSIS — S6010XA Contusion of unspecified finger with damage to nail, initial encounter: Secondary | ICD-10-CM

## 2024-11-13 DIAGNOSIS — S20224A Contusion of middle back wall of thorax, initial encounter: Secondary | ICD-10-CM | POA: Insufficient documentation

## 2024-11-13 DIAGNOSIS — Y92410 Unspecified street and highway as the place of occurrence of the external cause: Secondary | ICD-10-CM | POA: Insufficient documentation

## 2024-11-13 DIAGNOSIS — S2020XA Contusion of thorax, unspecified, initial encounter: Secondary | ICD-10-CM

## 2024-11-13 LAB — CBC WITH DIFFERENTIAL/PLATELET
Abs Immature Granulocytes: 0.07 K/uL (ref 0.00–0.07)
Basophils Absolute: 0.1 K/uL (ref 0.0–0.1)
Basophils Relative: 0 %
Eosinophils Absolute: 0 K/uL (ref 0.0–0.5)
Eosinophils Relative: 0 %
HCT: 44.7 % (ref 36.0–46.0)
Hemoglobin: 15 g/dL (ref 12.0–15.0)
Immature Granulocytes: 0 %
Lymphocytes Relative: 9 %
Lymphs Abs: 1.4 K/uL (ref 0.7–4.0)
MCH: 31.3 pg (ref 26.0–34.0)
MCHC: 33.6 g/dL (ref 30.0–36.0)
MCV: 93.3 fL (ref 80.0–100.0)
Monocytes Absolute: 0.7 K/uL (ref 0.1–1.0)
Monocytes Relative: 4 %
Neutro Abs: 14.4 K/uL — ABNORMAL HIGH (ref 1.7–7.7)
Neutrophils Relative %: 87 %
Platelets: 304 K/uL (ref 150–400)
RBC: 4.79 MIL/uL (ref 3.87–5.11)
RDW: 12.4 % (ref 11.5–15.5)
WBC: 16.7 K/uL — ABNORMAL HIGH (ref 4.0–10.5)
nRBC: 0 % (ref 0.0–0.2)

## 2024-11-13 LAB — COMPREHENSIVE METABOLIC PANEL WITH GFR
ALT: 5 U/L (ref 0–44)
AST: 29 U/L (ref 15–41)
Albumin: 4.6 g/dL (ref 3.5–5.0)
Alkaline Phosphatase: 64 U/L (ref 38–126)
Anion gap: 11 (ref 5–15)
BUN: 11 mg/dL (ref 6–20)
CO2: 21 mmol/L — ABNORMAL LOW (ref 22–32)
Calcium: 9.4 mg/dL (ref 8.9–10.3)
Chloride: 108 mmol/L (ref 98–111)
Creatinine, Ser: 0.8 mg/dL (ref 0.44–1.00)
GFR, Estimated: >60 mL/min
Glucose, Bld: 108 mg/dL — ABNORMAL HIGH (ref 70–99)
Potassium: 4.1 mmol/L (ref 3.5–5.1)
Sodium: 140 mmol/L (ref 135–145)
Total Bilirubin: 0.3 mg/dL (ref 0.0–1.2)
Total Protein: 7.3 g/dL (ref 6.5–8.1)

## 2024-11-13 LAB — URINALYSIS, ROUTINE W REFLEX MICROSCOPIC
Bacteria, UA: NONE SEEN
Bilirubin Urine: NEGATIVE
Glucose, UA: NEGATIVE mg/dL
Ketones, ur: NEGATIVE mg/dL
Leukocytes,Ua: NEGATIVE
Nitrite: NEGATIVE
Protein, ur: NEGATIVE mg/dL
Specific Gravity, Urine: 1.01 (ref 1.005–1.030)
pH: 6 (ref 5.0–8.0)

## 2024-11-13 LAB — HCG, SERUM, QUALITATIVE: Preg, Serum: NEGATIVE

## 2024-11-13 MED ORDER — PROMETHAZINE HCL 25 MG PO TABS: 25.0000 mg | ORAL_TABLET | Freq: Four times a day (QID) | ORAL | 0 refills | Status: AC | PRN

## 2024-11-13 MED ORDER — OXYCODONE-ACETAMINOPHEN 5-325 MG PO TABS: 1.0000 | ORAL_TABLET | Freq: Four times a day (QID) | ORAL | 0 refills | Status: AC | PRN

## 2024-11-13 MED ORDER — MORPHINE SULFATE (PF) 4 MG/ML IV SOLN
4.0000 mg | Freq: Once | INTRAVENOUS | Status: AC
  Administered 2024-11-13: 4 mg via INTRAVENOUS
  Filled 2024-11-13: qty 1

## 2024-11-13 MED ORDER — ONDANSETRON HCL 4 MG/2ML IJ SOLN
4.0000 mg | Freq: Once | INTRAMUSCULAR | Status: AC
  Administered 2024-11-13: 4 mg via INTRAVENOUS
  Filled 2024-11-13: qty 2

## 2024-11-13 MED ORDER — IOHEXOL 300 MG/ML  SOLN
100.0000 mL | Freq: Once | INTRAMUSCULAR | Status: AC | PRN
  Administered 2024-11-13: 80 mL via INTRAVENOUS
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
## 2024-12-02 ENCOUNTER — Encounter

## 2024-12-02 ENCOUNTER — Ambulatory Visit

## 2024-12-02 DIAGNOSIS — J4520 Mild intermittent asthma, uncomplicated: Secondary | ICD-10-CM
